# Patient Record
Sex: Male | Born: 1975 | Race: White | Hispanic: No | Marital: Married | Smoking: Former smoker
Health system: Southern US, Community
[De-identification: ages and names within clinical notes are randomized; demographics above are authoritative.]

## PROBLEM LIST (undated history)

## (undated) DIAGNOSIS — J45909 Unspecified asthma, uncomplicated: Secondary | ICD-10-CM

## (undated) DIAGNOSIS — A879 Viral meningitis, unspecified: Secondary | ICD-10-CM

## (undated) DIAGNOSIS — F909 Attention-deficit hyperactivity disorder, unspecified type: Secondary | ICD-10-CM

## (undated) DIAGNOSIS — K900 Celiac disease: Secondary | ICD-10-CM

## (undated) DIAGNOSIS — K219 Gastro-esophageal reflux disease without esophagitis: Secondary | ICD-10-CM

## (undated) HISTORY — DX: Viral meningitis, unspecified: A87.9

## (undated) HISTORY — DX: Gastro-esophageal reflux disease without esophagitis: K21.9

## (undated) HISTORY — PX: MENISECTOMY: SHX5181

## (undated) HISTORY — DX: Attention-deficit hyperactivity disorder, unspecified type: F90.9

---

## 2005-05-31 ENCOUNTER — Ambulatory Visit: Payer: Self-pay | Admitting: Internal Medicine

## 2005-10-02 ENCOUNTER — Emergency Department: Payer: Self-pay | Admitting: Emergency Medicine

## 2005-10-03 ENCOUNTER — Ambulatory Visit: Payer: Self-pay | Admitting: Emergency Medicine

## 2005-12-31 ENCOUNTER — Ambulatory Visit: Payer: Self-pay | Admitting: Family Medicine

## 2006-05-09 ENCOUNTER — Ambulatory Visit: Payer: Self-pay | Admitting: Family Medicine

## 2006-05-16 ENCOUNTER — Ambulatory Visit: Payer: Self-pay | Admitting: Family Medicine

## 2007-04-03 ENCOUNTER — Telehealth: Payer: Self-pay | Admitting: Internal Medicine

## 2007-04-09 ENCOUNTER — Ambulatory Visit: Payer: Self-pay | Admitting: Internal Medicine

## 2007-04-09 DIAGNOSIS — F988 Other specified behavioral and emotional disorders with onset usually occurring in childhood and adolescence: Secondary | ICD-10-CM

## 2007-04-14 ENCOUNTER — Encounter: Payer: Self-pay | Admitting: Internal Medicine

## 2007-04-29 ENCOUNTER — Telehealth (INDEPENDENT_AMBULATORY_CARE_PROVIDER_SITE_OTHER): Payer: Self-pay | Admitting: *Deleted

## 2007-05-12 ENCOUNTER — Encounter: Payer: Self-pay | Admitting: Internal Medicine

## 2007-05-12 DIAGNOSIS — K219 Gastro-esophageal reflux disease without esophagitis: Secondary | ICD-10-CM

## 2007-05-14 ENCOUNTER — Ambulatory Visit: Payer: Self-pay | Admitting: Internal Medicine

## 2007-06-18 ENCOUNTER — Telehealth (INDEPENDENT_AMBULATORY_CARE_PROVIDER_SITE_OTHER): Payer: Self-pay | Admitting: *Deleted

## 2007-07-22 ENCOUNTER — Telehealth (INDEPENDENT_AMBULATORY_CARE_PROVIDER_SITE_OTHER): Payer: Self-pay | Admitting: *Deleted

## 2007-08-14 ENCOUNTER — Ambulatory Visit: Payer: Self-pay | Admitting: Internal Medicine

## 2007-08-25 ENCOUNTER — Encounter: Payer: Self-pay | Admitting: Internal Medicine

## 2007-08-25 ENCOUNTER — Ambulatory Visit: Payer: Self-pay | Admitting: Urology

## 2007-12-11 ENCOUNTER — Telehealth (INDEPENDENT_AMBULATORY_CARE_PROVIDER_SITE_OTHER): Payer: Self-pay | Admitting: *Deleted

## 2007-12-11 ENCOUNTER — Ambulatory Visit: Payer: Self-pay | Admitting: Family Medicine

## 2007-12-11 DIAGNOSIS — M25569 Pain in unspecified knee: Secondary | ICD-10-CM | POA: Insufficient documentation

## 2007-12-18 ENCOUNTER — Encounter: Payer: Self-pay | Admitting: Internal Medicine

## 2008-01-26 ENCOUNTER — Encounter: Admission: RE | Admit: 2008-01-26 | Discharge: 2008-01-26 | Payer: Self-pay | Admitting: *Deleted

## 2008-02-23 ENCOUNTER — Ambulatory Visit: Payer: Self-pay | Admitting: Internal Medicine

## 2008-02-25 LAB — CONVERTED CEMR LAB
ALT: 50 units/L (ref 0–53)
AST: 31 units/L (ref 0–37)
Albumin: 4.3 g/dL (ref 3.5–5.2)
BUN: 9 mg/dL (ref 6–23)
Basophils Absolute: 0 10*3/uL (ref 0.0–0.1)
Bilirubin, Direct: 0.2 mg/dL (ref 0.0–0.3)
CO2: 32 meq/L (ref 19–32)
Chloride: 105 meq/L (ref 96–112)
Eosinophils Absolute: 0 10*3/uL (ref 0.0–0.6)
Eosinophils Relative: 0.7 % (ref 0.0–5.0)
GFR calc non Af Amer: 84 mL/min
Glucose, Bld: 88 mg/dL (ref 70–99)
Iron: 99 ug/dL (ref 42–165)
MCHC: 33.2 g/dL (ref 30.0–36.0)
MCV: 91.6 fL (ref 78.0–100.0)
Neutrophils Relative %: 47.2 % (ref 43.0–77.0)
Phosphorus: 2.9 mg/dL (ref 2.3–4.6)
Potassium: 4.6 meq/L (ref 3.5–5.1)
RDW: 12.4 % (ref 11.5–14.6)
Sodium: 141 meq/L (ref 135–145)
Total Bilirubin: 1.2 mg/dL (ref 0.3–1.2)
Total CHOL/HDL Ratio: 5.1
Triglycerides: 54 mg/dL (ref 0–149)
VLDL: 11 mg/dL (ref 0–40)

## 2008-03-25 ENCOUNTER — Telehealth (INDEPENDENT_AMBULATORY_CARE_PROVIDER_SITE_OTHER): Payer: Self-pay | Admitting: *Deleted

## 2008-03-28 ENCOUNTER — Telehealth (INDEPENDENT_AMBULATORY_CARE_PROVIDER_SITE_OTHER): Payer: Self-pay | Admitting: *Deleted

## 2008-06-24 ENCOUNTER — Ambulatory Visit: Payer: Self-pay | Admitting: Internal Medicine

## 2008-07-29 ENCOUNTER — Ambulatory Visit: Payer: Self-pay | Admitting: Internal Medicine

## 2008-08-29 ENCOUNTER — Telehealth: Payer: Self-pay | Admitting: Internal Medicine

## 2008-09-27 ENCOUNTER — Ambulatory Visit: Payer: Self-pay | Admitting: Internal Medicine

## 2008-10-26 ENCOUNTER — Telehealth: Payer: Self-pay | Admitting: Internal Medicine

## 2008-10-31 ENCOUNTER — Emergency Department (HOSPITAL_COMMUNITY): Admission: EM | Admit: 2008-10-31 | Discharge: 2008-10-31 | Payer: Self-pay | Admitting: Emergency Medicine

## 2008-11-08 ENCOUNTER — Ambulatory Visit: Payer: Self-pay | Admitting: Family Medicine

## 2008-11-08 DIAGNOSIS — S335XXA Sprain of ligaments of lumbar spine, initial encounter: Secondary | ICD-10-CM

## 2008-11-21 ENCOUNTER — Telehealth: Payer: Self-pay | Admitting: Internal Medicine

## 2008-12-15 ENCOUNTER — Encounter: Payer: Self-pay | Admitting: Internal Medicine

## 2008-12-30 ENCOUNTER — Ambulatory Visit: Payer: Self-pay | Admitting: Internal Medicine

## 2009-01-19 ENCOUNTER — Encounter (INDEPENDENT_AMBULATORY_CARE_PROVIDER_SITE_OTHER): Payer: Self-pay | Admitting: *Deleted

## 2009-01-20 ENCOUNTER — Encounter: Admission: RE | Admit: 2009-01-20 | Discharge: 2009-01-20 | Payer: Self-pay | Admitting: Orthopaedic Surgery

## 2009-02-16 ENCOUNTER — Telehealth: Payer: Self-pay | Admitting: Family Medicine

## 2009-03-06 ENCOUNTER — Ambulatory Visit: Payer: Self-pay | Admitting: Family Medicine

## 2009-03-06 DIAGNOSIS — J020 Streptococcal pharyngitis: Secondary | ICD-10-CM | POA: Insufficient documentation

## 2009-03-06 LAB — CONVERTED CEMR LAB: Rapid Strep: NEGATIVE

## 2009-03-28 ENCOUNTER — Telehealth: Payer: Self-pay | Admitting: Internal Medicine

## 2009-04-27 ENCOUNTER — Ambulatory Visit: Payer: Self-pay | Admitting: Internal Medicine

## 2009-04-27 DIAGNOSIS — L723 Sebaceous cyst: Secondary | ICD-10-CM

## 2009-05-03 ENCOUNTER — Telehealth: Payer: Self-pay | Admitting: Internal Medicine

## 2009-06-09 ENCOUNTER — Telehealth: Payer: Self-pay | Admitting: Family Medicine

## 2009-06-26 ENCOUNTER — Ambulatory Visit: Payer: Self-pay | Admitting: Internal Medicine

## 2009-07-31 ENCOUNTER — Telehealth: Payer: Self-pay | Admitting: Internal Medicine

## 2009-09-14 ENCOUNTER — Telehealth: Payer: Self-pay | Admitting: Internal Medicine

## 2009-10-18 ENCOUNTER — Telehealth: Payer: Self-pay | Admitting: Internal Medicine

## 2009-11-24 ENCOUNTER — Telehealth: Payer: Self-pay | Admitting: Family Medicine

## 2009-12-26 ENCOUNTER — Ambulatory Visit: Payer: Self-pay | Admitting: Internal Medicine

## 2009-12-26 DIAGNOSIS — H9190 Unspecified hearing loss, unspecified ear: Secondary | ICD-10-CM | POA: Insufficient documentation

## 2010-01-04 ENCOUNTER — Encounter: Payer: Self-pay | Admitting: Internal Medicine

## 2010-01-31 ENCOUNTER — Telehealth: Payer: Self-pay | Admitting: Internal Medicine

## 2010-03-01 ENCOUNTER — Telehealth: Payer: Self-pay | Admitting: Family Medicine

## 2010-03-28 ENCOUNTER — Telehealth: Payer: Self-pay | Admitting: Internal Medicine

## 2010-05-01 ENCOUNTER — Telehealth: Payer: Self-pay | Admitting: Internal Medicine

## 2010-06-20 ENCOUNTER — Telehealth: Payer: Self-pay | Admitting: Family Medicine

## 2010-08-15 ENCOUNTER — Ambulatory Visit: Payer: Self-pay | Admitting: Family Medicine

## 2010-08-15 DIAGNOSIS — M25539 Pain in unspecified wrist: Secondary | ICD-10-CM | POA: Insufficient documentation

## 2010-08-15 DIAGNOSIS — M79609 Pain in unspecified limb: Secondary | ICD-10-CM

## 2010-09-04 ENCOUNTER — Telehealth: Payer: Self-pay | Admitting: Internal Medicine

## 2010-09-18 ENCOUNTER — Telehealth: Payer: Self-pay | Admitting: Internal Medicine

## 2010-10-24 ENCOUNTER — Encounter: Payer: Self-pay | Admitting: Internal Medicine

## 2010-10-24 ENCOUNTER — Telehealth (INDEPENDENT_AMBULATORY_CARE_PROVIDER_SITE_OTHER): Payer: Self-pay | Admitting: *Deleted

## 2010-12-31 ENCOUNTER — Telehealth: Payer: Self-pay | Admitting: Internal Medicine

## 2011-01-09 NOTE — Assessment & Plan Note (Signed)
Summary: R ARM PAIN/CLE   Vital Signs:  Patient profile:   35 year old male Height:      74 inches Weight:      225 pounds BMI:     28.99 Temp:     97.9 degrees F oral Pulse rate:   80 / minute Pulse rhythm:   regular BP sitting:   130 / 90  (left arm) Cuff size:   regular  Vitals Entered By: Linde Gillis CMA Duncan Dull) (August 15, 2010 10:41 AM) CC: right arm pain   History of Present Illness: 36 year old male:  Pinching and grasping of his hand   pain in forearm.  R side right-sided forearm pain, most painful when he is pronating. Minimal pain with biceps activation with the palm when it is facing upwards. Significantly painful when the palms placing downward. Rotational motions such as when using a screwdriver do hurt quite a bit. No trauma or accident. This has been hurting for the last 2-3 weeks.  b at thumbs.  at the base of his thumbs, bilaterally, does have some pain complicated with grasping with his fifth digit. screwdriver works on computer  R pronator:    Allergies: 1)  ! * Novacaine  Past History:  Past medical, surgical, family and social histories (including risk factors) reviewed, and no changes noted (except as noted below).  Past Medical History: Reviewed history from 05/14/2007 and no changes required. GERD ADHD  Past Surgical History: Reviewed history from 02/23/2008 and no changes required. 5/04  Viral meningitis 2/07  Right 5th metacarpal fracture 2/09 Left knee meniscectomy and plica removed --Dr Magnus Ivan  Family History: Reviewed history from 05/12/2007 and no changes required. Father:Doesn't know - In [place]  Mother: Alive 50 Siblings: 1 brother alive 60 Maternal GM died 63,? MI CV:  GM ? HBP:  Yes Maternal great uncle:  ? liver CA  Social History: Reviewed history from 04/09/2007 and no changes required. 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 Otherwise the pertinent positives of the ROS are noted above.    Physical Exam  General:  GEN: WDWN, NAD, Non-toxic, A & O x 3 HEENT: Atraumatic, Normocephalic. Neck supple. No masses, No LAD. Ears and Nose: No external deformity. CV: RRR, No M/G/R. No JVD. No thrill. No extra heart sounds. PULM: CTA B, no wheezes, crackles, rhonchi. No retractions. No resp. distress. No accessory muscle use. ABD: S, NT, ND, +BS. No rebound tenderness. No HSM.  EXTR: No c/c/e NEURO: Normal gait.  PSYCH: Normally interactive. Conversant. Not depressed or anxious appearing.  Calm demeanor.     Shoulder/Elbow Exam  General:    mild pain in the volar, radial aspect of the palm. Soft tissue and muscle fiber.  Elbow Exam:    Right:    Inspection:  Normal    Palpation:  Normal    Stability:  stable    Tenderness:  no    Swelling:  no    Erythema:  no    tenderness to palpation on the volar aspect of the mid shaft of the forearm rendered around the brachialis and brachioradialis. Pain with rotational motions terminally. No focal edema or bruising  Range of Motion:       Flexion-Active: 135       Extension-Active: 0       Flexion-Passive: 135       Extension-Passive: 0       Elbow Flexion: > 60 seconds    Left:    Inspection:  Normal    Palpation:  Normal    Stability:  stable    Tenderness:  no    Swelling:  no    Erythema:  no    Range of Motion:       Flexion-Active: 135       Extension-Active: 0       Flexion-Passive: 135       Extension-Passive: 0       Elbow Flexion: > 60 seconds   Impression & Recommendations:  Problem # 1:  PAIN IN JOINT, FOREARM (UEA-540.98) Assessment New pronator  syndrome, r  Using an anatomical model, I reviewed with the patient the structures involved and how they related to her diagnosis. The patient indicated that they understood our discussion and the anatomy involved.   reviewed [facility] ketoprofen gel  Problem # 2:  HAND PAIN (ICD-729.5) reassured, all soft tissue overuse  Complete Medication List: 1)  Multivitamins Tabs (Multiple vitamin) .Marland Kitchen.. 1 by mouth daily 2)  Ibuprofen 800 Mg Tabs (Ibuprofen) .... One tab after meals, three times a day 3)  Amphetamine-dextroamphetamine 20 Mg Tabs (Amphetamine-dextroamphetamine) .Marland Kitchen.. 1 two times a day as directed for attention problems 4)  Compound Ketoprofen 10% Topical Lipoderm  .... Pcca # [id number], apply up to 4 times daily as directed (disp 1 month supply) Prescriptions: COMPOUND KETOPROFEN 10% TOPICAL LIPODERM PCCA # [id number], Apply up to 4 times daily as directed (disp 1 month supply)  #1 x 5   Entered and Authorized by:   Hannah Beat MD   Signed by:   Hannah Beat MD on 08/15/2010   Method used:   Print then Give to Patient   RxID:   [id number]   Current Allergies (reviewed today): ! * NOVACAINE

## 2011-01-09 NOTE — Assessment & Plan Note (Signed)
Summary: CPX/RBH   History of Present Illness: Left after 20-25 minutes when he was about to be brought to room Apparently just couldn't wait  Didn't set up another appt Given that he has not been seen by me for 9 months, I may not be able to refill his stimulant medication before another visit is made  Allergies: 1)  ! * Novacaine   Complete Medication List: 1)  Multivitamins Tabs (Multiple vitamin) .Marland Kitchen.. 1 by mouth daily 2)  Ibuprofen 800 Mg Tabs (Ibuprofen) .... One tab after meals, three times a day 3)  Amphetamine-dextroamphetamine 20 Mg Tabs (Amphetamine-dextroamphetamine) .Marland Kitchen.. 1 two times a day as directed for attention problems 4)  Compound Ketoprofen 10% Topical Lipoderm  .... Pcca # [id number], apply up to 4 times daily as directed (disp 1 month supply) 5)  Amphetamine-dextroamphetamine 15 Mg Tabs (Amphetamine-dextroamphetamine) .Marland Kitchen.. 1 tab by mouth two times a day as directed 6)  Amphetamine-dextroamphetamine 5 Mg Tabs (Amphetamine-dextroamphetamine) .Marland Kitchen.. 1 tab by mouth two times a day as directed  Other Orders: No Charge Patient Arrived (NCPA0) (NCPA0)   Orders Added: 1)  No Charge Patient Arrived (NCPA0) [NCPA0]

## 2011-01-09 NOTE — Progress Notes (Signed)
Summary: need to change adderall script  Phone Note From Pharmacy   Caller: [facility] [phone]. Summary of Call: Pt's adderalll dose is on back order.  [facility] is asking if you will write new scripts for 15 and 5 mg's so that pt will get his daily dose of 20 mg's.  The 20 mg's are on back order until december. Initial call taken by: Lowella Petties CMA,  September 18, 2010 4:41 PM  Follow-up for Phone Call        Rx done Follow-up by: Cindee Salt MD,  September 19, 2010 2:12 PM  Additional Follow-up for Phone Call Additional follow up Details #1::        Spoke with patient and advised rx ready for pick-up  Additional Follow-up by: Mervin Hack CMA Duncan Dull),  September 20, 2010 2:18 PM    New/Updated Medications: AMPHETAMINE-DEXTROAMPHETAMINE 15 MG TABS (AMPHETAMINE-DEXTROAMPHETAMINE) 1 tab by mouth two times a day as directed AMPHETAMINE-DEXTROAMPHETAMINE 5 MG TABS (AMPHETAMINE-DEXTROAMPHETAMINE) 1 tab by mouth two times a day as directed Prescriptions: AMPHETAMINE-DEXTROAMPHETAMINE 5 MG TABS (AMPHETAMINE-DEXTROAMPHETAMINE) 1 tab by mouth two times a day as directed  #60 x 0   Entered and Authorized by:   Cindee Salt MD   Signed by:   Cindee Salt MD on 09/19/2010   Method used:   Print then Give to Patient   RxID:   [id number] AMPHETAMINE-DEXTROAMPHETAMINE 15 MG TABS (AMPHETAMINE-DEXTROAMPHETAMINE) 1 tab by mouth two times a day as directed  #60 x 0   Entered and Authorized by:   Cindee Salt MD   Signed by:   Cindee Salt MD on 09/19/2010   Method used:   Print then Give to Patient   RxID:   [id number]

## 2011-01-09 NOTE — Progress Notes (Signed)
Summary: Rx Adderall  Phone Note Call from Patient Call back at [phone]   Caller: Patient Call For: Cindee Salt MD Summary of Call: Patient needs a written rx for his Adderall. Please call when ready for pickup.  Initial call taken by: Sydell Axon LPN,  September 04, 2010 11:14 AM  Follow-up for Phone Call        Rx written Follow-up by: Cindee Salt MD,  September 04, 2010 2:06 PM  Additional Follow-up for Phone Call Additional follow up Details #1::        Spoke with patient and advised rx ready for pick-up  Additional Follow-up by: Mervin Hack CMA Duncan Dull),  September 04, 2010 3:07 PM    New/Updated Medications: AMPHETAMINE-DEXTROAMPHETAMINE 20 MG TABS (AMPHETAMINE-DEXTROAMPHETAMINE) 1 two times a day as directed for attention problems Prescriptions: AMPHETAMINE-DEXTROAMPHETAMINE 20 MG TABS (AMPHETAMINE-DEXTROAMPHETAMINE) 1 two times a day as directed for attention problems  #60 x 0   Entered and Authorized by:   Cindee Salt MD   Signed by:   Cindee Salt MD on 09/04/2010   Method used:   Print then Give to Patient   RxID:   [id number]

## 2011-01-09 NOTE — Consult Note (Signed)
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

## 2011-01-09 NOTE — Assessment & Plan Note (Signed)
Summary: 6 month follow-up   Vital Signs:  Patient profile:   34 year old male Weight:      220 pounds BMI:     28.35 Temp:     98.1 degrees F oral Pulse rate:   80 / minute Pulse rhythm:   regular BP sitting:   120 / 70  (left arm) Cuff size:   regular  Vitals Entered By: Linde Gillis CMA Duncan Dull) (December 26, 2009 8:04 AM) CC: 6 month follow up   History of Present Illness: Doing well No new concerns  Notices that he gets irritate if he is around a lot of noise troubel with large crowds "It almost drives me to insanity" generally he can control it but has had some recent situations that he has had to leave his wife and kids (the RV show at [facility], the outlets) Noise alone can do it --even at house  Notices more hearing problems lately  Work is okay sleeps okay  Allergies: 1)  ! * Novacaine  Past History:  Past medical, surgical, family and social histories (including risk factors) reviewed for relevance to current acute and chronic problems.  Past Medical History: Reviewed history from 05/14/2007 and no changes required. GERD ADHD  Past Surgical History: Reviewed history from 02/23/2008 and no changes required. 5/04  Viral meningitis 2/07  Right 5th metacarpal fracture 2/09 Left knee meniscectomy and plica removed --Dr Magnus Ivan  Family History: Reviewed history from 05/12/2007 and no changes required. Father:Doesn't know - In [place]  Mother: Alive 30 Siblings: 1 brother alive 101 Maternal GM died 60,? MI CV:  GM ? HBP:  Yes Maternal great uncle:  ? liver CA  Social History: Reviewed history from 04/09/2007 and no changes required. 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   Ears:  R ear normal and L ear normal.   Psych:  normally interactive, good eye contact, not anxious appearing, and not depressed appearing.     Impression & Recommendations:  Problem # 1:  HEARING LOSS (ICD-389.9) Assessment New  will set up ENT eval may be part of why he has auditory sensitivity now  Orders: ENT Referral (ENT)  Problem # 2:  ATTENTION DEFICIT DISORDER, ADULT (ICD-314.00) Assessment: Unchanged doing okay with the med will continue current dose  Complete Medication List: 1)  Multivitamins Tabs (Multiple vitamin) .Marland Kitchen.. 1 by mouth daily 2)  Ibuprofen 800 Mg Tabs (Ibuprofen) .... One tab after meals, three times a day 3)  Amphetamine-dextroamphetamine 20 Mg Tabs (Amphetamine-dextroamphetamine) .Marland Kitchen.. 1 two times a day as directed for attention problems  Patient Instructions: 1)  Please schedule a follow-up appointment in 6 months for physical 2)  Referral Appointment Information 3)  Day/Date: 4)  Time: 5)  Place/MD: 6)  Address: 7)  Phone/Fax: 8)  Patient given appointment information. Information/Orders faxed/mailed. Prescriptions: AMPHETAMINE-DEXTROAMPHETAMINE 20 MG TABS (AMPHETAMINE-DEXTROAMPHETAMINE) 1 two times a day as directed for attention problems  #60 x 0   Entered and Authorized by:   Cindee Salt MD   Signed by:   Cindee Salt MD on 12/26/2009   Method used:   Print then Give to Patient   RxID:   [id number]   Current  Allergies (reviewed today): ! * NOVACAINE

## 2011-01-09 NOTE — Progress Notes (Signed)
Summary: refill requests for adderall  Phone Note Refill Request Call back at Home Phone [phone] Message from:  Patient  Refills Requested: Medication #1:  AMPHETAMINE-DEXTROAMPHETAMINE 15 MG TABS 1 tab by mouth two times a day as directed  Medication #2:  AMPHETAMINE-DEXTROAMPHETAMINE 5 MG TABS 1 tab by mouth two times a day as directed.  Medication #3:  AMPHETAMINE-DEXTROAMPHETAMINE 20 MG TABS 1 two times a day as directed for attention problems Please call pt when ready.  Initial call taken by: Lowella Petties CMA, AAMA,  October 24, 2010 11:09 AM  Follow-up for Phone Call        Sp w/ pt, told him per Dr. Alphonsus Sias. He would need an appt. Told pt we believe in giving good pt care to all of our pt. Told him pts need to be monitor by the physicians before contining to precribe any meds.. Pt says he will not be coming back for another appt. Says this is not the first time he has waited and he did not want to wait anymore.  Told pt I would make sure his concerns are  addressed. FYI  to Dr. Alphonsus Sias and Jamesetta So.Daine Gip  October 24, 2010 11:24 AM  SInce we are not discharging him, we have no obligation to do anything more here As long as he knows we are willing to give him appt to get evaluation and continue his meds He can request records be sent to another physician Cindee Salt MD  October 24, 2010 11:42 AM

## 2011-01-09 NOTE — Progress Notes (Signed)
Summary: refill request for adderall  Phone Note Refill Request Call back at Home Phone [phone] Message from:  Patient  Refills Requested: Medication #1:  AMPHETAMINE-DEXTROAMPHETAMINE 20 MG TABS 1 two times a day as directed for attention problems. Please call pt when ready.  Initial call taken by: Lowella Petties CMA,  March 01, 2010 9:20 AM    Prescriptions: AMPHETAMINE-DEXTROAMPHETAMINE 20 MG TABS (AMPHETAMINE-DEXTROAMPHETAMINE) 1 two times a day as directed for attention problems  #60 x 0   Entered and Authorized by:   Ruthe Mannan MD   Signed by:   Ruthe Mannan MD on 03/01/2010   Method used:   Print then Give to Patient   RxID:   [id number]   Appended Document: refill request for adderall Patient Advised. Prescription left at front desk.

## 2011-01-09 NOTE — Progress Notes (Signed)
Summary: needs refill on adderall  Phone Note Refill Request Call back at Home Phone [phone] Call back at [phone] Message from:  Patient  Refills Requested: Medication #1:  AMPHETAMINE-DEXTROAMPHETAMINE 20 MG TABS 1 two times a day as directed for attention problems. Phoned request from pt. This is early but he is going out of town on saturday, going to [place] and will not be able to fill it out of state.  Initial call taken by: Lowella Petties CMA,  March 28, 2010 10:52 AM  Follow-up for Phone Call        Rx written Follow-up by: Cindee Salt MD,  March 28, 2010 1:38 PM  Additional Follow-up for Phone Call Additional follow up Details #1::        Spoke with patient and advised rx ready for pick-up  Additional Follow-up by: Mervin Hack CMA Duncan Dull),  March 28, 2010 2:42 PM    Prescriptions: AMPHETAMINE-DEXTROAMPHETAMINE 20 MG TABS (AMPHETAMINE-DEXTROAMPHETAMINE) 1 two times a day as directed for attention problems  #60 x 0   Entered and Authorized by:   Cindee Salt MD   Signed by:   Cindee Salt MD on 03/28/2010   Method used:   Print then Give to Patient   RxID:   [id number]

## 2011-01-09 NOTE — Progress Notes (Signed)
Summary: refill request for adderall  Phone Note Refill Request Call back at Home Phone [phone] Message from:  Patient  Refills Requested: Medication #1:  AMPHETAMINE-DEXTROAMPHETAMINE 20 MG TABS 1 two times a day as directed for attention problems. Please call pt when ready.  Initial call taken by: Lowella Petties CMA,  January 31, 2010 9:43 AM  Follow-up for Phone Call        Rx written Follow-up by: Cindee Salt MD,  January 31, 2010 1:59 PM  Additional Follow-up for Phone Call Additional follow up Details #1::        Spoke with patient and advised rx ready for pick-up  Additional Follow-up by: Mervin Hack CMA Duncan Dull),  January 31, 2010 2:09 PM    New/Updated Medications: AMPHETAMINE-DEXTROAMPHETAMINE 20 MG TABS (AMPHETAMINE-DEXTROAMPHETAMINE) 1 two times a day as directed for attention problems Prescriptions: AMPHETAMINE-DEXTROAMPHETAMINE 20 MG TABS (AMPHETAMINE-DEXTROAMPHETAMINE) 1 two times a day as directed for attention problems  #60 x 0   Entered and Authorized by:   Cindee Salt MD   Signed by:   Cindee Salt MD on 01/31/2010   Method used:   Print then Give to Patient   RxID:   [id number]

## 2011-01-09 NOTE — Progress Notes (Signed)
Summary: Adderall  Phone Note Refill Request Call back at [phone] Message from:  Patient on May 01, 2010 11:01 AM  Refills Requested: Medication #1:  AMPHETAMINE-DEXTROAMPHETAMINE 20 MG TABS 1 two times a day as directed for attention problems. Please call patient when prescription is ready for pickup.    Method Requested: Pick up at Office Initial call taken by: Delilah Shan CMA Duncan Dull),  May 01, 2010 11:01 AM  Follow-up for Phone Call        Rx written Follow-up by: Cindee Salt MD,  May 01, 2010 1:41 PM  Additional Follow-up for Phone Call Additional follow up Details #1::        left message on machine that rx ready for pick-up  Additional Follow-up by: DeShannon Smith CMA Duncan Dull),  May 01, 2010 2:46 PM    Prescriptions: AMPHETAMINE-DEXTROAMPHETAMINE 20 MG TABS (AMPHETAMINE-DEXTROAMPHETAMINE) 1 two times a day as directed for attention problems  #60 x 0   Entered and Authorized by:   Cindee Salt MD   Signed by:   Cindee Salt MD on 05/01/2010   Method used:   Print then Give to Patient   RxID:   [id number]

## 2011-01-09 NOTE — Progress Notes (Signed)
Summary: adderall   Phone Note Refill Request Call back at Home Phone [phone] Call back at [phone] Message from:  Patient on June 20, 2010 1:47 PM  Refills Requested: Medication #1:  AMPHETAMINE-DEXTROAMPHETAMINE 20 MG TABS 1 two times a day as directed for attention problems.  Method Requested: Pick up at Office Initial call taken by: Melody Comas,  June 20, 2010 2:28 PM  Follow-up for Phone Call        Patient has enough for and will be out and dr Alphonsus Sias will be out until monday  , also does pt needs to sign Controlled Substance Contract?  Follow-up by: Benny Lennert CMA Duncan Dull),  June 20, 2010 4:00 PM  Additional Follow-up for Phone Call Additional follow up Details #1::        Yes, but this should be done at his next office visit. Additional Follow-up by: Hannah Beat MD,  June 20, 2010 5:15 PM    Prescriptions: AMPHETAMINE-DEXTROAMPHETAMINE 20 MG TABS (AMPHETAMINE-DEXTROAMPHETAMINE) 1 two times a day as directed for attention problems  #60 x 0   Entered and Authorized by:   Hannah Beat MD   Signed by:   Hannah Beat MD on 06/20/2010   Method used:   Print then Give to Patient   RxID:   [id number]   Appended Document: adderall  Patient advised rx ready for pick up.Consuello Masse CMA

## 2011-01-10 NOTE — Progress Notes (Signed)
Summary: Want to switch to [facility]  Phone Note Call from Patient Call back at [facility] Phone [phone]   Caller: Patient Call For: Dr.Duncan Summary of Call: Pt. would like to switch from Dr.Bee Marchiano to you.  He said it has been too hard to make an appt. w/ Dr.Brei Pociask and the last time he came he had to wait 1 1/2 hours.  It's hard for him to be flexible w/ his job.  Pt has run out of his Adderall and would like to come in to see you to get a refill.  Please advise. Initial call taken by: Beau Fanny,  December 31, 2010 3:21 PM  Follow-up for Phone Call        If Alphonsus Sias is okay with the change, then it's okay with me.  The last set of rxs would need to come through Dr. Alphonsus Sias to get him through to seeing me.  Please advise patient that while I try to run on time, unexpected patient concerns can put me behind schedule.  he would need OV with me before I can rx his ADD meds.  Follow-up by: Crawford Givens MD,  December 31, 2010 4:29 PM  Additional Follow-up for Phone Call Additional follow up Details #1::        Okay with me The last visit I was 35 minutes late and about to walk into his room and he left Cindee Salt MD  December 31, 2010 4:36 PM     Additional Follow-up for Phone Call Additional follow up Details #2::    Noted.  Please get me a OV with patient in next few weeks and see if Dr. Alphonsus Sias will rx the meds one last time.  Crawford Givens MD,  December 31, 2010 4:50 PM  Rx done Have to give extended release since no regular med around Cindee Salt MD  December 31, 2010 5:04 PM   no answer at home number, left message on work number that identifies him on the machine, advised pt that rx would be up front and that he needs to schedule visit with Dr.Duncan, also wrote note on rx to schedule visit with Dr.Duncan. Follow-up by: Mervin Hack CMA (AAMA),  January 01, 2011 8:21 AM  New/Updated Medications: AMPHETAMINE-DEXTROAMPHETAMINE 20 MG TABS  (AMPHETAMINE-DEXTROAMPHETAMINE) 1 two times a day as directed for attention problems AMPHETAMINE-DEXTROAMPHETAMINE 20 MG XR24H-CAP (AMPHETAMINE-DEXTROAMPHETAMINE) 1 capsule two times a day for attention problems Prescriptions: AMPHETAMINE-DEXTROAMPHETAMINE 20 MG XR24H-CAP (AMPHETAMINE-DEXTROAMPHETAMINE) 1 capsule two times a day for attention problems  #60 x 0   Entered and Authorized by:   Cindee Salt MD   Signed by:   Cindee Salt MD on 12/31/2010   Method used:   Print then Give to Patient   RxID:   [id number]

## 2011-01-15 ENCOUNTER — Ambulatory Visit: Payer: Self-pay | Admitting: Family Medicine

## 2011-01-17 ENCOUNTER — Ambulatory Visit (INDEPENDENT_AMBULATORY_CARE_PROVIDER_SITE_OTHER): Payer: BC Managed Care – PPO | Admitting: Family Medicine

## 2011-01-17 ENCOUNTER — Encounter: Payer: Self-pay | Admitting: Family Medicine

## 2011-01-17 DIAGNOSIS — F988 Other specified behavioral and emotional disorders with onset usually occurring in childhood and adolescence: Secondary | ICD-10-CM

## 2011-01-17 DIAGNOSIS — M79609 Pain in unspecified limb: Secondary | ICD-10-CM

## 2011-01-24 NOTE — Assessment & Plan Note (Signed)
Summary: 30 MIN APPT,OK'D BY DR Ladarrian Asencio/CLE   Vital Signs:  Patient profile:   35 year old male Height:      74 inches Weight:      226.25 pounds BMI:     29.15 Temp:     98.2 degrees F oral Pulse rate:   80 / minute Pulse rhythm:   regular BP sitting:   142 / 90  (left arm) Cuff size:   large  Vitals Entered By: Delilah Shan CMA Bibiana Gillean Dull) (January 17, 2011 2:18 PM)  Serial Vital Signs/Assessments:  Time      Position  BP       Pulse  Resp  Temp     By 2:25 PM   R Arm     130/96                         Lugene Fuquay CMA (AAMA)  CC: 30 min. appt.    History of Present Illness: Compliant with meds:yes benefit from med (ie increase in concentration):yes, less impulsive change in mood: minimal, usually when coming off the medicine change in appetite:some decrease in appetite, weight is now stable Insomnia: no change- usually 5-6 hours per night, at baseline tremor:no compliant with behavioral modification:  H/o speeding tickets.  Likely FH of ADD.  Longstanding symptoms for patient.  Dx'd at age 33.  Was tested at the time.  Started on meds about 2 years ago.  Compliant.  Would like to cut the dose on the weekend.  No abuse/misuse of meds.  We talked about this today.  Misuse/abuse would prevent me from continuing to rx the meds.    R thenar cramping when he works with his hands.  R handed.  Not tender to palpation o/w.  No weakness.   Current Medications (verified): 1)  Multivitamins  Tabs (Multiple Vitamin) .Marland Kitchen.. 1 By Mouth Daily 2)  Ibuprofen 800 Mg Tabs (Ibuprofen) .... One Tab After Meals, Three Times A Day As Needed, Gi Caution 3)  Amphetamine-Dextroamphetamine 20 Mg Xr24h-Cap (Amphetamine-Dextroamphetamine) .Marland Kitchen.. 1 Capsule Two Times A Day For Attention Problems  Allergies: 1)  ! * Novacaine  Past History:  Past Medical History: Last updated: 05/14/2007 GERD ADHD  Past Surgical History: Last updated: 02/23/2008 5/04  Viral meningitis 2/07  Right 5th metacarpal  fracture 2/09 Left knee meniscectomy and plica removed --Dr Magnus Ivan  Family History: Reviewed history from 05/12/2007 and no changes required. Father:Doesn't know - In [place]  Mother: Alive, celiac disease, possible crohns?, likely ADD Siblings: 1 brother alive Maternal GM died 18,? MI CV:  GM ? HBP:  Yes Maternal great uncle:  ? liver CA  Social History: Reviewed history from 04/09/2007 and no changes required. 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  Otherwise negative.    Physical Exam  General:  GEN: nad, alert and oriented, affect wnl and appropriate HEENT: mucous membranes moist NECK: supple w/o LA CV: rrr.  PULM: ctab, no inc wob ABD: soft, +bs EXT: no edema CN 2-12 wnl, s/s/dtr wnl x4.  No tremor.   R hand with normal inspection.  No weakness or thenar wasting.  nv intact.  Lourena Simmonds is neg.  no carpal entrapment symptoms on testing   Impression & Recommendations:  Problem # 1:  ATTENTION DEFICIT DISORDER, ADULT (  ICD-314.00) >25 min spent with patient, at least half of which was spent on counseling.  Will decrease the dose on weekend and he'll call back with update on symptoms/BP.  He agrees.  If he is doing well, we can do rx for 3 month duration- 3 rxs with 1 month each.  He agrees.  Plan to follow up later in the summer.  See instructions.  d/w patient [id number] mods to help with ADD.  He understood.   Problem # 2:  HAND PAIN (ICD-729.5) He'll stretch before work and cut down on soda to see if this helps.  follow up as needed.    Complete Medication List: 1)  Multivitamins Tabs (Multiple vitamin) .Marland Kitchen.. 1 by mouth daily 2)  Ibuprofen 800 Mg Tabs (Ibuprofen) .... One tab after meals, three times a day as needed, gi caution 3)  Adderall Xr 10 Mg  Xr24h-cap (Amphetamine-dextroamphetamine) .... 3 by mouth once daily on monday to friday and 2 by mouth once daily on saturday and sunday  Patient Instructions: 1)  Call with an update on your blood pressure.   2)  I would stretch your hand before work and try to cut down on soda. 3)  See how the new med dose works and call me with an update on that.   4)  I would plan on a follow up appointment- - in 6 months.  fasting lipid and glucose before the visit.  v17.3 5)  Take care.  Glad to see you today.  Prescriptions: ADDERALL XR 10 MG XR24H-CAP (AMPHETAMINE-DEXTROAMPHETAMINE) 3 by mouth once daily on Monday to Friday and 2 by mouth once daily on Saturday and Sunday  #80 x 0   Entered and Authorized by:   Crawford Givens MD   Signed by:   Crawford Givens MD on 01/17/2011   Method used:   Print then Give to Patient   RxID:   [id number]    Orders Added: 1)  Est. Patient Level IV [86578]    Current Allergies (reviewed today): ! * NOVACAINE

## 2011-04-16 ENCOUNTER — Other Ambulatory Visit: Payer: Self-pay | Admitting: *Deleted

## 2011-04-16 MED ORDER — AMPHETAMINE-DEXTROAMPHET ER 10 MG PO CP24: ORAL_CAPSULE | ORAL | Status: DC

## 2011-04-16 NOTE — Telephone Encounter (Signed)
Patient advised.  Rx's left at front desk for pick up. 

## 2011-04-16 NOTE — Telephone Encounter (Signed)
Please call pt when ready, please check quantity amount.

## 2011-04-16 NOTE — Telephone Encounter (Signed)
3 rxs done.  Please give to patient.

## 2011-07-16 ENCOUNTER — Other Ambulatory Visit: Payer: BC Managed Care – PPO

## 2011-07-17 ENCOUNTER — Encounter: Payer: Self-pay | Admitting: Family Medicine

## 2011-07-17 ENCOUNTER — Ambulatory Visit (INDEPENDENT_AMBULATORY_CARE_PROVIDER_SITE_OTHER): Payer: BC Managed Care – PPO | Admitting: Family Medicine

## 2011-07-17 VITALS — BP 140/90 | HR 100 | Temp 97.5°F | Ht 75.0 in | Wt 228.4 lb

## 2011-07-17 DIAGNOSIS — M25561 Pain in right knee: Secondary | ICD-10-CM

## 2011-07-17 DIAGNOSIS — L02415 Cutaneous abscess of right lower limb: Secondary | ICD-10-CM

## 2011-07-17 DIAGNOSIS — L03119 Cellulitis of unspecified part of limb: Secondary | ICD-10-CM

## 2011-07-17 DIAGNOSIS — M25569 Pain in unspecified knee: Secondary | ICD-10-CM

## 2011-07-17 NOTE — Patient Instructions (Signed)
Appt with Dr. Rayburn Ma at 12:45 PM today at [facility]

## 2011-07-17 NOTE — Progress Notes (Signed)
  Subjective:    Patient ID: Brandon Barr, male    DOB: 01/22/1976, 35 y.o.   MRN: [id number]  HPI  R knee: Pressure in his knee. On Saturday -- literally could not walk on R knee. 5 days ago, had swelling, redness. 1st medical evaluation today.  Wife had some doxycycline and he took it and the swelling went away or at least decreased.  Wife has had MRSA in the past - has had a couple of MRSA skin infections in the last 18 months.   Pain in the suprapatellar bursa region, pain with compression of the kneecap. Redness and slight fluctuant area over patella on the right. Decreased ROM  Now able to walk.  The PMH, PSH, Social History, Family History, Medications, and allergies have been reviewed in [facility], and have been updated if relevant.  Review of Systems REVIEW OF SYSTEMS  GEN: No fevers, chills. Nontoxic. Primarily MSK c/o today. Warmth and history above. MSK: Detailed in the HPI GI: tolerating PO intake without difficulty Neuro: No numbness, parasthesias, or tingling associated. Otherwise the pertinent positives of the ROS are noted above.      Objective:   Physical Exam   Physical Exam  Blood pressure 140/90, pulse 100, temperature 97.5 F (36.4 C), temperature source Oral, height 6\' 3"  (1.905 m), weight 228 lb 6.4 oz (103.602 kg), SpO2 98.00%.  GEN: WDWN, NAD, Non-toxic, A & O x 3 HEENT: Atraumatic, Normocephalic. Neck supple. No masses, No LAD. Ears and Nose: No external deformity. EXTR: No c/c/e NEURO Normal gait.  PSYCH: Normally interactive. Conversant. Not depressed or anxious appearing.  Calm demeanor.   Knee, R: 3 cm across red flat area with erythema and area of centralized fluctuance and a whitehead appearance. Pain with compression of suprapatellar bursa, compression of patella. Restriction of motion to 100 flexion. Warm diffusely anteriorly.     Assessment & Plan:   1. Right knee pain   2. Abscess of knee, right    High level of concern for abscess  that may communicate to knee. Concern for potential septic joint. Suspect partially treated with self medication of doxycycline and improved ability to ambulate.   Explained nature of my concern and potential complications and arranged for surgical evaluation today by Dr. Rayburn Barr. I appreciate his assistance.

## 2011-07-18 ENCOUNTER — Other Ambulatory Visit: Payer: BC Managed Care – PPO

## 2011-07-19 ENCOUNTER — Other Ambulatory Visit (INDEPENDENT_AMBULATORY_CARE_PROVIDER_SITE_OTHER): Payer: BC Managed Care – PPO | Admitting: Family Medicine

## 2011-07-19 DIAGNOSIS — Z8249 Family history of ischemic heart disease and other diseases of the circulatory system: Secondary | ICD-10-CM

## 2011-07-19 LAB — LIPID PANEL
Cholesterol: 150 mg/dL (ref 0–200)
HDL: 39.5 mg/dL (ref >39.00)
Total CHOL/HDL Ratio: 4
Triglycerides: 66 mg/dL (ref 0.0–149.0)
VLDL: 13.2 mg/dL (ref 0.0–40.0)

## 2011-07-19 LAB — GLUCOSE, RANDOM: Glucose, Bld: 100 mg/dL — ABNORMAL HIGH (ref 70–99)

## 2011-07-23 ENCOUNTER — Ambulatory Visit (INDEPENDENT_AMBULATORY_CARE_PROVIDER_SITE_OTHER): Payer: BC Managed Care – PPO | Admitting: Family Medicine

## 2011-07-23 ENCOUNTER — Encounter: Payer: Self-pay | Admitting: Family Medicine

## 2011-07-23 DIAGNOSIS — F988 Other specified behavioral and emotional disorders with onset usually occurring in childhood and adolescence: Secondary | ICD-10-CM

## 2011-07-23 DIAGNOSIS — Z23 Encounter for immunization: Secondary | ICD-10-CM

## 2011-07-23 MED ORDER — AMPHETAMINE-DEXTROAMPHETAMINE 20 MG PO TABS: 20.0000 mg | ORAL_TABLET | Freq: Every day | ORAL | Status: DC

## 2011-07-23 NOTE — Progress Notes (Signed)
His knee infection resolved and didn't need surgery.  No FCNAV.  No pain now. Weight bearing.  Glucose 100.  Labs d/w pt.  Occ exercise, jogging/swimming.  We talked about diet.  He doesn't have a lot of sweets in diet.   Screening for hyperlipidemia.  Labs d/w pt.   ADD Compliant with meds: yes benefit from med (ie increase in concentration): benefit has waned change in mood: some, due to insomnia change in appetite: not decreased Insomnia:yes, worse with extended release tremor:no compliant with behavioral modification: yes He would like to switch back to plain adderall, not extended release.    PMH and SH reviewed  ROS: See HPI.  Otherwise noncontributory.   Meds, vitals, and allergies reviewed.   GEN: nad, alert and oriented, affect wnl and appropriate HEENT: mucous membranes moist NECK: supple w/o LA CV: rrr.  PULM: ctab, no inc wob ABD: soft, +bs EXT: no edema CN 2-12 wnl, s/s/dtr wnl x4.  No tremor.

## 2011-07-23 NOTE — Progress Notes (Signed)
Addended by: Sydell Axon C on: 07/23/2011 09:17 AM   Modules accepted: Orders

## 2011-07-23 NOTE — Patient Instructions (Signed)
Try the short acting adderall and see if that helps with the insomnia.  Let me know if you have other concerns.  Take care.  Gradually increase your amount of exercise.  Schedule a follow up appointment in 6 months.  30 min visit.

## 2011-07-23 NOTE — Assessment & Plan Note (Addendum)
>  25 min spent with face to face with patient, >50% counseling.  Change to short acting adderall and he'll call back as needed with update.  3 months of rxs are done today.  Plan for 6 months f/u.  No illicits.  He agrees with plan.  Also d/w pt about behav mods and exercise (esp for sugar).  His knee pain is resolved.

## 2011-10-18 ENCOUNTER — Telehealth: Payer: Self-pay | Admitting: Internal Medicine

## 2011-10-18 MED ORDER — AMPHETAMINE-DEXTROAMPHETAMINE 20 MG PO TABS: 40.0000 mg | ORAL_TABLET | Freq: Every day | ORAL | Status: DC

## 2011-10-18 NOTE — Telephone Encounter (Signed)
I called pt.  He doesn't have good effect with current dose and would like to increase.  Will continue the short acting form to try to prevent insomnia.  He'll call back if he has ADE.  O/w plan for f/u in 3 months.  3 rxs printed.  Will be at the front desk for pick up on Monday.

## 2011-10-18 NOTE — Telephone Encounter (Signed)
Patient called and stated the lower dose of Adderall is not working, and he is requesting 40 mg daily which was what he was previously taking.  He wanted to know if he could be switched back.  Please advise.

## 2011-10-18 NOTE — Telephone Encounter (Signed)
Addended by: Lars Mage on: 10/18/2011 05:07 PM   Modules accepted: Orders

## 2011-12-24 ENCOUNTER — Telehealth: Payer: Self-pay | Admitting: *Deleted

## 2011-12-24 NOTE — Telephone Encounter (Signed)
Received faxed form from pharmacy requesting prior authorization on Adderall. Called and request paperwork from [employer] to start the process for PA. Paperwork is in your in box to complete and fax back.

## 2011-12-25 NOTE — Telephone Encounter (Signed)
Completed form faxed back. To [employer].  Copy sent to be scanned into the chart.

## 2011-12-25 NOTE — Telephone Encounter (Signed)
Please scan and send back.  Thanks.

## 2012-01-28 ENCOUNTER — Telehealth: Payer: Self-pay | Admitting: Family Medicine

## 2012-01-28 MED ORDER — AMPHETAMINE-DEXTROAMPHETAMINE 20 MG PO TABS: 40.0000 mg | ORAL_TABLET | Freq: Every day | ORAL | Status: DC

## 2012-01-28 NOTE — Telephone Encounter (Signed)
[place], [place] [address] p. [phone] f. [phone] To: [facility] (Daytime Triage) Fax: [phone] From: [facility] Date/ Time: 01/28/2012 12:12 PM Taken By: Thayer Headings, RN Caller: Doug Facility: not collected Patient: Brandon Barr, Brandon Barr DOB: 1976/11/20 Phone: [phone] Reason for Call: Pt calling today 01/28/12 regarding refill for Adderall written by Dr. Para March. His refill has expired. Has to call every 3 months to get refill. Pt says he has had office visit in last 6 months. [facility] [address] [place]. Needs to know when he can come by office to pick up this prescription. It cannot be called in. PLEASE CALL PT BACK AT [phone]. Regarding Appointment: Appt Date: Appt Time: Unknown Provider: Reason: Details: Outcome: confidential

## 2012-01-28 NOTE — Telephone Encounter (Signed)
Pt was seen 8/12.  Is due for another OV, I usually see people 2x/year if doing well.  Rx x3 printed.  He can get 3 copies; we can't do more than that at one time.

## 2012-01-28 NOTE — Telephone Encounter (Signed)
Pt advised.  Rx left at front desk for pick up. 

## 2012-04-23 ENCOUNTER — Ambulatory Visit (INDEPENDENT_AMBULATORY_CARE_PROVIDER_SITE_OTHER): Payer: BC Managed Care – PPO | Admitting: Family Medicine

## 2012-04-23 ENCOUNTER — Encounter: Payer: Self-pay | Admitting: Family Medicine

## 2012-04-23 VITALS — BP 130/88 | HR 88 | Temp 98.2°F | Wt 227.8 lb

## 2012-04-23 DIAGNOSIS — M549 Dorsalgia, unspecified: Secondary | ICD-10-CM

## 2012-04-23 DIAGNOSIS — F988 Other specified behavioral and emotional disorders with onset usually occurring in childhood and adolescence: Secondary | ICD-10-CM

## 2012-04-23 MED ORDER — AMPHETAMINE-DEXTROAMPHETAMINE 20 MG PO TABS: 40.0000 mg | ORAL_TABLET | Freq: Every day | ORAL | Status: DC

## 2012-04-23 NOTE — Assessment & Plan Note (Signed)
Benign MSK exam, needs to stretch hamstrings/low back and this was discussed.  F/u prn.

## 2012-04-23 NOTE — Patient Instructions (Signed)
Stretch your hamstrings and don't change your meds.  Take care.  Recheck in 6 months.  Call with questions.  Glad to see you.

## 2012-04-23 NOTE — Progress Notes (Signed)
ADD  Compliant with meds: yes  benefit from med (ie increase in concentration): yes change in mood: no, better on short acting version on med change in appetite: not decreased  Insomnia: no tremor:no  compliant with behavioral modification: yes  He switched back to plain adderall, not extended release, and did well He cut out caffeine.  He had some HA for 3-4 days but then felt much better.   Doing well at work.   He had some episodic back pain.  Lower, bilateral.  It has self resolved.  It may have been related to inactivity.  It can happen in AM after getting out of bed. Rare NSAID use.  No radiation. No weakness.    ROS: See HPI. Otherwise noncontributory.   Meds, vitals, and allergies reviewed.   GEN: nad, alert and oriented, affect wnl and appropriate  HEENT: mucous membranes moist  NECK: supple w/o LA  CV: rrr.  PULM: ctab, no inc wob  ABD: soft, +bs  EXT: no edema  CN 2-12 wnl, s/s/dtr wnl x4. No tremor Back not ttp and normal rom at hips.  Hamstrings tight.  SLR neg x2.

## 2012-04-23 NOTE — Assessment & Plan Note (Signed)
Doing well, continue current meds.  3 months of rxs done. Recheck in 6 months, sooner prn.

## 2012-06-15 ENCOUNTER — Ambulatory Visit (INDEPENDENT_AMBULATORY_CARE_PROVIDER_SITE_OTHER): Payer: BC Managed Care – PPO | Admitting: Family Medicine

## 2012-06-15 ENCOUNTER — Encounter: Payer: Self-pay | Admitting: Family Medicine

## 2012-06-15 VITALS — BP 128/88 | HR 84 | Temp 97.7°F | Wt 231.0 lb

## 2012-06-15 DIAGNOSIS — M549 Dorsalgia, unspecified: Secondary | ICD-10-CM

## 2012-06-15 MED ORDER — CYCLOBENZAPRINE HCL 10 MG PO TABS: 5.0000 mg | ORAL_TABLET | Freq: Three times a day (TID) | ORAL | Status: AC | PRN

## 2012-06-15 NOTE — Progress Notes (Signed)
Back pain.  Saturday AM, sudden onset pain with twisting.  It resolved but then came back later in the day.  He had diffuse muscle tightness in trunk.  He has a tender point on the L side of the spine, mid T spine, where the pain originates.   Ibuprofen and aleve don't help.  Flexeril didn't help but it was out of date.  No trauma or trigger recently.  No bowel/bladder dysfunction.  No weakness.  No rash.  Meds, vitals, and allergies reviewed.   ROS: See HPI.  Otherwise, noncontributory.  nad ncat Back w/o midline pain L mid T spine with muscle spasm noted, ttp.  No rash Pain with forward flexion at the site of the spasm No pain with extension/facet loading.  Distally NV intact Neck with normal ROM

## 2012-06-15 NOTE — Assessment & Plan Note (Signed)
Benign exam, no need to image at this point.  Would use stretching, knee to chest, trunk rotation, back flexion exercises.  Gi caution for nsaids, use aleve but not additional ibuprofen.  Use flexeril and heat.  Should improve.  F/u prn.

## 2012-06-15 NOTE — Patient Instructions (Signed)
Take aleve with food, use a heating pad and stretch as we discussed.  Take flexeril as needed.  It can make you drowsy.  Take care.

## 2012-07-21 ENCOUNTER — Other Ambulatory Visit: Payer: Self-pay | Admitting: Family Medicine

## 2012-07-21 MED ORDER — AMPHETAMINE-DEXTROAMPHETAMINE 20 MG PO TABS: 40.0000 mg | ORAL_TABLET | Freq: Every day | ORAL | Status: DC

## 2012-07-21 NOTE — Telephone Encounter (Signed)
He'll be able to pick up tomorrow.  Printed.

## 2012-07-21 NOTE — Telephone Encounter (Signed)
Caller: Doug/Patient; Patient Name: Brandon Barr; PCP: Crawford Givens Clelia Croft); [facility] Phone Number: [phone]; needs rx for Adderall; using his last pill today 07/21/12

## 2012-07-21 NOTE — Telephone Encounter (Signed)
Patient advised.  Rx left at front desk for pick up. 

## 2012-08-20 ENCOUNTER — Other Ambulatory Visit: Payer: Self-pay | Admitting: Family Medicine

## 2012-08-20 MED ORDER — AMPHETAMINE-DEXTROAMPHETAMINE 20 MG PO TABS: 40.0000 mg | ORAL_TABLET | Freq: Every day | ORAL | Status: DC

## 2012-08-20 NOTE — Telephone Encounter (Signed)
Caller: Doug/Patient; Patient Name: Brandon Barr; PCP: Crawford Givens Clelia Croft) [facility]); [facility] Phone Number: [phone].  Pt. requesting refills on Adderal 20mg ; take 2 tablets daily.  Please prescribe 3 separate prescriptions, and post date 2 of them.  Please call pt. when these are ready for pickup.  CAN/db.

## 2012-08-20 NOTE — Telephone Encounter (Signed)
3 rxs printed, 2 postdated.  Please give to patient.  Thanks.

## 2012-08-20 NOTE — Telephone Encounter (Signed)
Patient advised.  Rx left at front desk for pick up. 

## 2012-10-26 ENCOUNTER — Ambulatory Visit: Payer: BC Managed Care – PPO | Admitting: Family Medicine

## 2012-10-27 ENCOUNTER — Other Ambulatory Visit: Payer: BC Managed Care – PPO

## 2012-10-27 ENCOUNTER — Ambulatory Visit (INDEPENDENT_AMBULATORY_CARE_PROVIDER_SITE_OTHER): Payer: BC Managed Care – PPO | Admitting: Family Medicine

## 2012-10-27 ENCOUNTER — Encounter: Payer: Self-pay | Admitting: Family Medicine

## 2012-10-27 VITALS — BP 116/86 | HR 74 | Temp 97.9°F | Wt 233.0 lb

## 2012-10-27 DIAGNOSIS — N529 Male erectile dysfunction, unspecified: Secondary | ICD-10-CM

## 2012-10-27 DIAGNOSIS — F988 Other specified behavioral and emotional disorders with onset usually occurring in childhood and adolescence: Secondary | ICD-10-CM

## 2012-10-27 DIAGNOSIS — R739 Hyperglycemia, unspecified: Secondary | ICD-10-CM

## 2012-10-27 DIAGNOSIS — R5383 Other fatigue: Secondary | ICD-10-CM

## 2012-10-27 DIAGNOSIS — R7309 Other abnormal glucose: Secondary | ICD-10-CM

## 2012-10-27 DIAGNOSIS — R6882 Decreased libido: Secondary | ICD-10-CM

## 2012-10-27 DIAGNOSIS — R5381 Other malaise: Secondary | ICD-10-CM

## 2012-10-27 MED ORDER — AMPHETAMINE-DEXTROAMPHETAMINE 20 MG PO TABS: 40.0000 mg | ORAL_TABLET | Freq: Every day | ORAL | Status: DC

## 2012-10-27 NOTE — Progress Notes (Signed)
ADD Compliant with meds: yes benefit from med (ie increase in concentration): yes change in mood: no change in appetite: no sig changes Insomnia: no Tremor: no compliant with behavioral modification: yes He still has some trouble with long term organization and planning, in spite of meds.  Working from home.   He cut out all soda/caffeine. HA are much improved.    Sex drive is decreased.  Fatigue, ED.  Started in last 2-3 years, progressively worse.    Already had a flu shot.    PMH and SH reviewed  ROS: See HPI.  Otherwise noncontributory.   Meds, vitals, and allergies reviewed.   GEN: nad, alert and oriented, affect wnl and appropriate HEENT: mucous membranes moist NECK: supple w/o LA CV: rrr.  PULM: ctab, no inc wob ABD: soft, +bs EXT: no edema CN 2-12 wnl, s/s/dtr wnl x4.  No tremor.

## 2012-10-27 NOTE — Patient Instructions (Addendum)
Come back for fasting labs.  We'll contact you with your lab report. Don't change your meds for now. Recheck in 6 months. Take care.

## 2012-10-28 ENCOUNTER — Other Ambulatory Visit (INDEPENDENT_AMBULATORY_CARE_PROVIDER_SITE_OTHER): Payer: BC Managed Care – PPO

## 2012-10-28 ENCOUNTER — Telehealth: Payer: Self-pay

## 2012-10-28 DIAGNOSIS — N529 Male erectile dysfunction, unspecified: Secondary | ICD-10-CM | POA: Insufficient documentation

## 2012-10-28 DIAGNOSIS — R739 Hyperglycemia, unspecified: Secondary | ICD-10-CM

## 2012-10-28 DIAGNOSIS — R6882 Decreased libido: Secondary | ICD-10-CM

## 2012-10-28 DIAGNOSIS — R7309 Other abnormal glucose: Secondary | ICD-10-CM

## 2012-10-28 DIAGNOSIS — R5383 Other fatigue: Secondary | ICD-10-CM

## 2012-10-28 LAB — TESTOSTERONE: Testosterone: 489.86 ng/dL (ref 350.00–890.00)

## 2012-10-28 LAB — GLUCOSE, RANDOM: Glucose, Bld: 108 mg/dL — ABNORMAL HIGH (ref 70–99)

## 2012-10-28 LAB — TSH: TSH: 1.58 u[IU]/mL (ref 0.35–5.50)

## 2012-10-28 NOTE — Telephone Encounter (Signed)
Pt requested lab values just discussed on phone earlier;glucose 108; tsh 1.58; testosterone 489.86 and hgb 17.4 were given to pt.

## 2012-10-28 NOTE — Assessment & Plan Note (Signed)
T isn't low and other labs are unremarkable other than mild inc in sugar.  See notes on labs.  Will offer treatment with meds but would rec inc in exercise in meantime.

## 2012-10-28 NOTE — Assessment & Plan Note (Signed)
Some improvement with meds, would continue as is.  He agrees.  Continue with behavioral compensation.  Recheck 6 months.

## 2012-11-09 ENCOUNTER — Encounter: Payer: BC Managed Care – PPO | Admitting: Family Medicine

## 2012-12-17 ENCOUNTER — Ambulatory Visit (INDEPENDENT_AMBULATORY_CARE_PROVIDER_SITE_OTHER): Payer: BC Managed Care – PPO | Admitting: Family Medicine

## 2012-12-17 ENCOUNTER — Encounter: Payer: Self-pay | Admitting: Family Medicine

## 2012-12-17 VITALS — BP 120/82 | HR 84 | Temp 98.4°F | Wt 243.0 lb

## 2012-12-17 DIAGNOSIS — R221 Localized swelling, mass and lump, neck: Secondary | ICD-10-CM

## 2012-12-17 DIAGNOSIS — R220 Localized swelling, mass and lump, head: Secondary | ICD-10-CM

## 2012-12-17 DIAGNOSIS — Z7721 Contact with and (suspected) exposure to potentially hazardous body fluids: Secondary | ICD-10-CM

## 2012-12-17 LAB — CBC WITH DIFFERENTIAL/PLATELET
Basophils Absolute: 0 10*3/uL (ref 0.0–0.1)
HCT: 50.9 % (ref 39.0–52.0)
Lymphs Abs: 2.7 10*3/uL (ref 0.7–4.0)
MCV: 90.3 fl (ref 78.0–100.0)
Monocytes Absolute: 0.4 10*3/uL (ref 0.1–1.0)
Neutro Abs: 3.1 10*3/uL (ref 1.4–7.7)
Platelets: 176 10*3/uL (ref 150.0–400.0)
RDW: 13.1 % (ref 11.5–14.6)

## 2012-12-17 NOTE — Progress Notes (Signed)
Lump on neck, single lesion, under L side of the jaw.  Present for about 1 month. Not ttp.  Noted incidentally.  It isn't getting smaller, slowly enlarging per patient.  Feeling well except for longstanding fatigue.  No FCNAVD but he has occ hot flashes and sweats at night. No weight loss. No rhinorrhea, no ST.  R ear is ringing some.  Distant tobacco use, none current.  No other skin lesions or lumps known.   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   Meds, vitals, and allergies reviewed.   ROS: See HPI.  Otherwise, noncontributory.  nad ncat Tm w/o erythema Nasal and OP exam wnl Small, nontender area inferior to L angle of the jaw note but no other masses or LA noted rrr ctab

## 2012-12-17 NOTE — Patient Instructions (Addendum)
Go to the lab on the way out.  We'll contact you with your lab report.  If the spot on your neck doesn't go down and you can still feel it in 2 weeks, then notify me.  Take care.

## 2012-12-18 DIAGNOSIS — Z7721 Contact with and (suspected) exposure to potentially hazardous body fluids: Secondary | ICD-10-CM | POA: Insufficient documentation

## 2012-12-18 DIAGNOSIS — R221 Localized swelling, mass and lump, neck: Secondary | ICD-10-CM | POA: Insufficient documentation

## 2012-12-18 NOTE — Assessment & Plan Note (Signed)
Check HIV though not a high risk exposure history.

## 2012-12-18 NOTE — Assessment & Plan Note (Signed)
Possible submandibular salivary gland enlargement vs node.  Would check CBC and if persisting 2 more weeks, he'll notify the clinic.  He agrees.  Consider imaging vs ent referral then.

## 2013-01-14 ENCOUNTER — Telehealth: Payer: Self-pay

## 2013-01-14 NOTE — Telephone Encounter (Signed)
Prior auth needed Adderall; form in Dr Lianne Bushy in box.

## 2013-01-14 NOTE — Telephone Encounter (Signed)
Will address the hard copy.  

## 2013-01-19 NOTE — Telephone Encounter (Signed)
adderall approved; [facility] notified and letter placed on Dr Lianne Bushy desk for signature and scanning.

## 2013-02-05 ENCOUNTER — Encounter: Payer: Self-pay | Admitting: Family Medicine

## 2013-02-05 ENCOUNTER — Ambulatory Visit (INDEPENDENT_AMBULATORY_CARE_PROVIDER_SITE_OTHER): Payer: BC Managed Care – PPO | Admitting: Family Medicine

## 2013-02-05 VITALS — BP 122/92 | HR 82 | Temp 98.3°F | Wt 234.0 lb

## 2013-02-05 DIAGNOSIS — R221 Localized swelling, mass and lump, neck: Secondary | ICD-10-CM

## 2013-02-05 DIAGNOSIS — R220 Localized swelling, mass and lump, head: Secondary | ICD-10-CM

## 2013-02-05 NOTE — Patient Instructions (Signed)
See Marion about your referral before you leave today. Take care.  

## 2013-02-05 NOTE — Progress Notes (Signed)
Still with a lump on the L side of the neck.  He doesn't think it is getting smaller, unclear if it is enlarging.  It isn't ttp.  Feels well o/w.  No FCNAVD.  No tobacco.    Meds, vitals, and allergies reviewed.   ROS: See HPI.  Otherwise, noncontributory.  nad ncat Tm wnl Nasal and OP exam wnl Neck supple, no LA except for small (<1cm) mobile mass under the L angle of the jaw No other masses No skin changes o/w locally.

## 2013-02-08 ENCOUNTER — Ambulatory Visit: Payer: Self-pay | Admitting: Family Medicine

## 2013-02-08 NOTE — Assessment & Plan Note (Signed)
Refer for u/s given the duration of the mass.  He agrees.  ddx d/w. No other alarming sx or signs.

## 2013-02-09 ENCOUNTER — Encounter: Payer: Self-pay | Admitting: Family Medicine

## 2013-03-09 ENCOUNTER — Telehealth: Payer: Self-pay | Admitting: Family Medicine

## 2013-03-09 DIAGNOSIS — R591 Generalized enlarged lymph nodes: Secondary | ICD-10-CM

## 2013-03-09 NOTE — Telephone Encounter (Signed)
Patient called to ask you to refer him Brandon Barr , the lump in his neck is still there and he wants to proceed with the referral. Best number to call him is [phone]. Or home#[phone]. Patient says he sent a Mychart message last week.

## 2013-03-09 NOTE — Telephone Encounter (Signed)
I don't recall getting the mychart message and I don't see it in EPIC.  Please pass this along to A Sauls to check on this.   I put in the referral.  Thanks.

## 2013-03-19 ENCOUNTER — Ambulatory Visit: Payer: Self-pay | Admitting: Otolaryngology

## 2013-04-28 ENCOUNTER — Telehealth: Payer: Self-pay | Admitting: Family Medicine

## 2013-04-28 MED ORDER — AMPHETAMINE-DEXTROAMPHETAMINE 20 MG PO TABS: 40.0000 mg | ORAL_TABLET | Freq: Every day | ORAL | Status: DC

## 2013-04-28 NOTE — Telephone Encounter (Signed)
Patient notified by telephone that script is up front and ready for pickup. 

## 2013-04-28 NOTE — Telephone Encounter (Signed)
Printed. I'll sign when I get to clinic.  Thanks.

## 2013-04-28 NOTE — Telephone Encounter (Signed)
Called to request three prescriptions (ie: 90 days supply) for Adderall 20 mg Dispense #60; sig: 1 po BID.  Used last tablet 04/28/13. Reports Rx last filled 03/28/13.  Please call [phone] when Rx  ready for pick up.

## 2013-04-29 ENCOUNTER — Encounter: Payer: Self-pay | Admitting: Family Medicine

## 2013-05-17 ENCOUNTER — Encounter: Payer: Self-pay | Admitting: Family Medicine

## 2013-06-09 ENCOUNTER — Telehealth: Payer: Self-pay

## 2013-06-09 NOTE — Telephone Encounter (Signed)
Pt received EOB for urine ck done by [facility];advised pt needs to contact [facility] [phone]. Pt said got EOB and test done out of network and pt thinks will owe $ 850.00. Pt wants to know what 7 test were done. Pt said he did not authorize the testing but felt his prescription was being held hostage unless he did test.Pt will contact [facility] but request cb from office manager.

## 2013-10-14 ENCOUNTER — Other Ambulatory Visit: Payer: Self-pay

## 2014-08-25 ENCOUNTER — Encounter: Payer: Self-pay | Admitting: Family Medicine

## 2014-08-25 ENCOUNTER — Ambulatory Visit (INDEPENDENT_AMBULATORY_CARE_PROVIDER_SITE_OTHER): Payer: BC Managed Care – PPO | Admitting: Family Medicine

## 2014-08-25 VITALS — BP 104/68 | HR 77 | Temp 97.5°F | Wt 225.2 lb

## 2014-08-25 DIAGNOSIS — F988 Other specified behavioral and emotional disorders with onset usually occurring in childhood and adolescence: Secondary | ICD-10-CM

## 2014-08-25 DIAGNOSIS — R220 Localized swelling, mass and lump, head: Secondary | ICD-10-CM

## 2014-08-25 DIAGNOSIS — Z23 Encounter for immunization: Secondary | ICD-10-CM

## 2014-08-25 DIAGNOSIS — R071 Chest pain on breathing: Secondary | ICD-10-CM

## 2014-08-25 DIAGNOSIS — R221 Localized swelling, mass and lump, neck: Secondary | ICD-10-CM

## 2014-08-25 DIAGNOSIS — R0789 Other chest pain: Secondary | ICD-10-CM

## 2014-08-25 DIAGNOSIS — J9801 Acute bronchospasm: Secondary | ICD-10-CM

## 2014-08-25 MED ORDER — AMPHETAMINE-DEXTROAMPHETAMINE 20 MG PO TABS: 10.0000 mg | ORAL_TABLET | Freq: Every day | ORAL | Status: DC

## 2014-08-25 MED ORDER — ALBUTEROL SULFATE HFA 108 (90 BASE) MCG/ACT IN AERS: 2.0000 | INHALATION_SPRAY | Freq: Four times a day (QID) | RESPIRATORY_TRACT | Status: DC | PRN

## 2014-08-25 NOTE — Patient Instructions (Signed)
Restart the adderall and notify me if not better on 10-20mg  a day. Start with  a day for about 1 week, at least.  I'll check on the laughter-induced breathing changes.   The chest wall pain should get better slowly.  Take care.

## 2014-08-25 NOTE — Progress Notes (Signed)
Pre visit review using our clinic review tool, if applicable. No additional management support is needed unless otherwise documented below in the visit note.  Had ENT f/u re: neck mass, per patient was an unremarkable LN and didn't need f/u.    H/o ADD.  Off meds.  Wanted to get back on.  "The worst I've done.  Brain fog.  Can't keep things together and finish a task."  He is noting a change at work and his wife has noted it.  Sleeping okay.  He has some insomnia even off meds; the meds weren't likely the issue with insomnia prev.  Mood --> more irritable. No speeding tickets.  Weight is down, with diet and exercise. Exercise has helped his mood, when he can commit to it. His motivation is low.  No SI/HI.  Not depressed but annoyed with his concentration troubles.  He's trying to make lists, etc, but he is still having trouble with ADD sx.    He has a h/o asthma as a child. In the last year, has likely bronchospasm brought on by a hard laugh.  Better with SABA.  Laughter is the only thing that causes this.  He can feel it coming on.  Will get SOB, lasting about 30 sec.  Son with similar.  No wheeze o/w.  Asking about options.    Chest wall pain.  Pain with cough, palpation at the sternum.  No trauma.  No bruising.  Can her a pop in the chest near the sternum with stretching.   Meds, vitals, and allergies reviewed.   ROS: See HPI.  Otherwise, noncontributory.  GEN: nad, alert and oriented HEENT: mucous membranes moist NECK: supple w/o LA CV: rrr.  Chest wall ttp at the costochondral joints PULM: ctab, no inc wob ABD: soft, +bs EXT: no edema SKIN: no acute rash CN 2-12 wnl B, S/S/DTR wnl x4

## 2014-08-26 DIAGNOSIS — R0789 Other chest pain: Secondary | ICD-10-CM | POA: Insufficient documentation

## 2014-08-26 DIAGNOSIS — J9801 Acute bronchospasm: Secondary | ICD-10-CM | POA: Insufficient documentation

## 2014-08-26 NOTE — Assessment & Plan Note (Signed)
Likely dx with laughter trigger.  Normal neck exam, no stridor.  ctab today.  Use saba prn and I'll look into other options in the meantime.  He agrees.

## 2014-08-26 NOTE — Assessment & Plan Note (Addendum)
Restart adderall at , up to  if needed.  He agrees.  Will update me. 3 rxs done at OV.  Routine cautions. No illicits.  >25 minutes spent in face to face time with patient, >50% spent in counselling or coordination of care.

## 2014-08-26 NOTE — Assessment & Plan Note (Signed)
Should resolve, d/w pt about anatomy and limiting ROM- can apply pressure externally during a cough.

## 2014-08-26 NOTE — Assessment & Plan Note (Signed)
With prev ENT eval.

## 2014-09-05 ENCOUNTER — Telehealth: Payer: Self-pay | Admitting: Family Medicine

## 2014-09-05 NOTE — Telephone Encounter (Signed)
Notify pt.  I did some checking on this, it took longer than I anticipated and I apologize. Since his sx are episodic, I wouldn't change his meds at this point.  I would continue with PRN SABA use.  If needed frequently, we'll need to consider a med change.  Have him update [place]- see how he is doing in the meantime. Thanks.

## 2014-09-06 NOTE — Telephone Encounter (Signed)
Patient notified as instructed by telephone. Patient stated that he has used the Albuterol once or twice and it seems to be working okay. Patient stated that he has been taking Adderall 10 mg two times a day and it does not seem to be working much at all.  In the past he has taken Adderall 40 mg once a day and does not want to go on that strong of a dose again. Patient thinks taking 15 mg two times a day may work.

## 2014-09-06 NOTE — Telephone Encounter (Signed)
Noted, thanks. I would try taking 20mg  in Am and 10mg  later in the day to equal 30mg . See how that does and let me know. That way he can use the pills he has.  We can adjust the dose/pill with the next rx. Thanks.

## 2014-09-07 NOTE — Telephone Encounter (Signed)
Patient notified as instructed by telephone. Patient stated that he will call with an update after he tries the adjustment on the medication.

## 2014-09-17 IMAGING — CT CT NECK WITH CONTRAST
1 of 2 series · 10 of 14 positions shown, 13 images · IV contrast (agent unspecified)
Comparison: none

REASON FOR EXAM: enlarged lymphnode
COMMENTS:

PROCEDURE:     CT  - CT NECK WITH CONTRAST  - March 19, 2013  [DATE]
RESULT:     Comparison: None.
TECHNIQUE: Multiple axial images were obtained of the neck, after the
ministration of 70 mL of 1sovue-CW4 intravenous contrast.

[Series 2: soft tissue · axial · 0.51mm/px · z∈[-297,-78]mm · 10 of 91 slices shown, 13 images]
[im 9/91  soft-tissue]
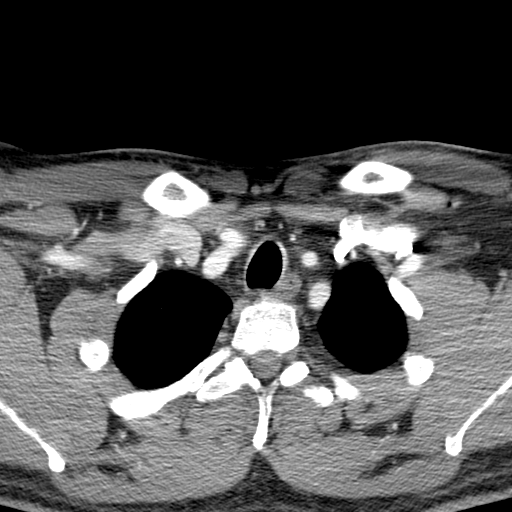
[im 9/91  bone]
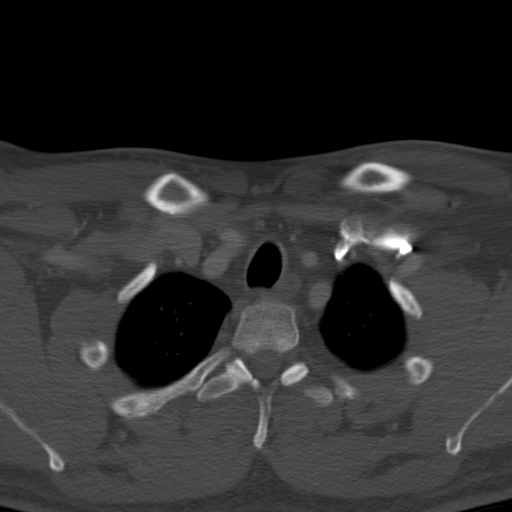
[im 17/91  bone]
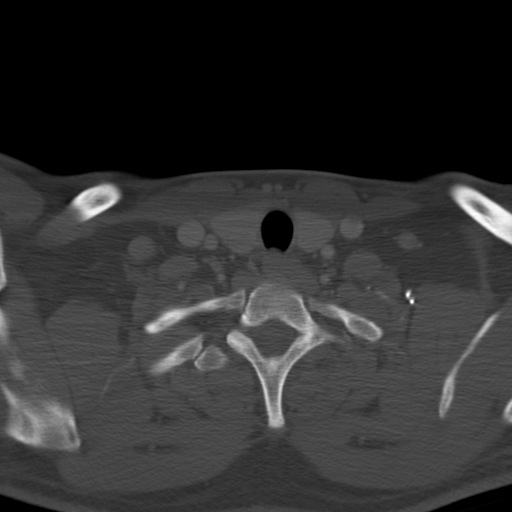
[im 25/91  bone]
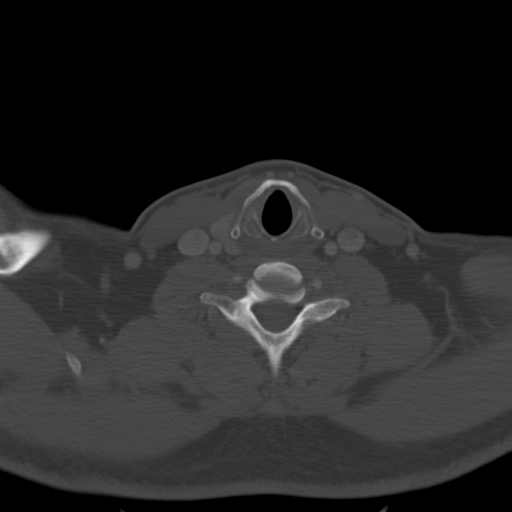
[im 33/91  bone]
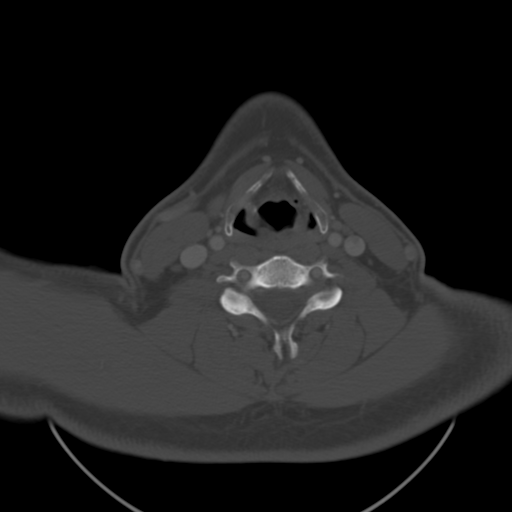
[im 41/91  soft-tissue]
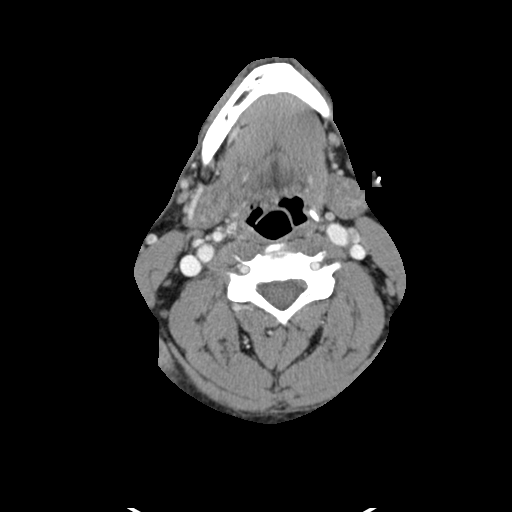
[im 41/91  bone]
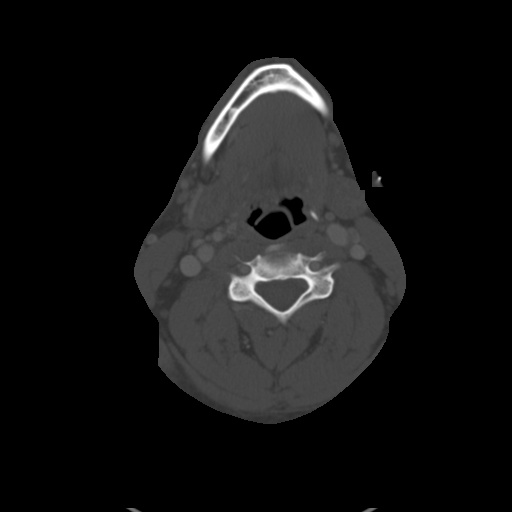
[im 50/91  bone]
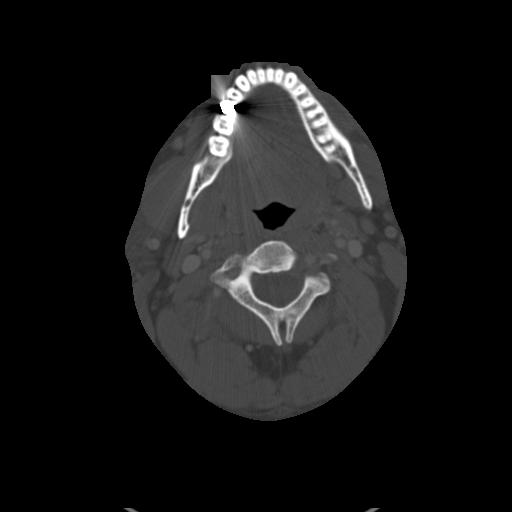
[im 58/91  bone]
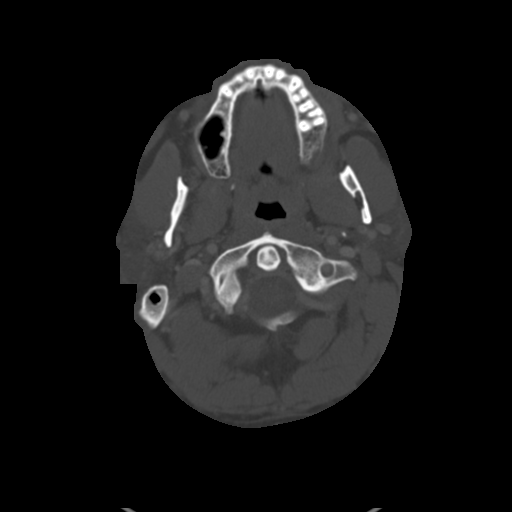
[im 66/91  bone]
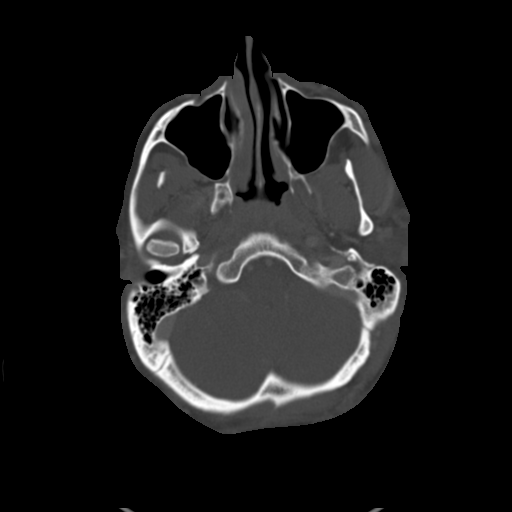
[im 74/91  soft-tissue]
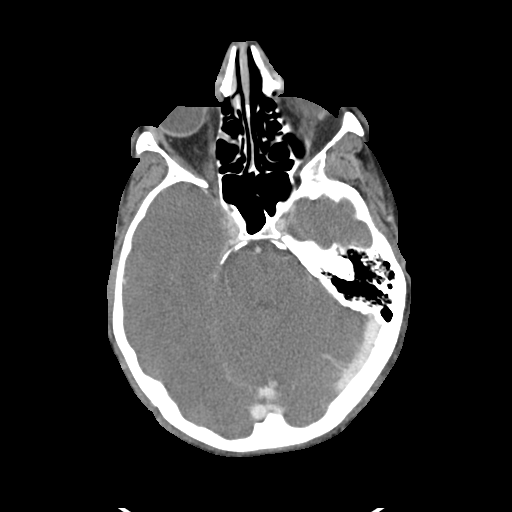
[im 74/91  bone]
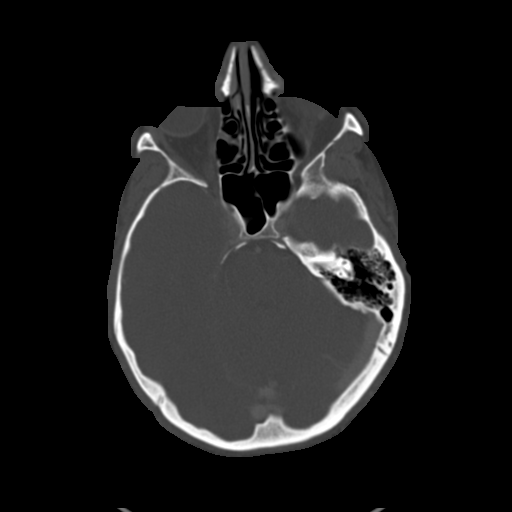
[im 82/91  bone]
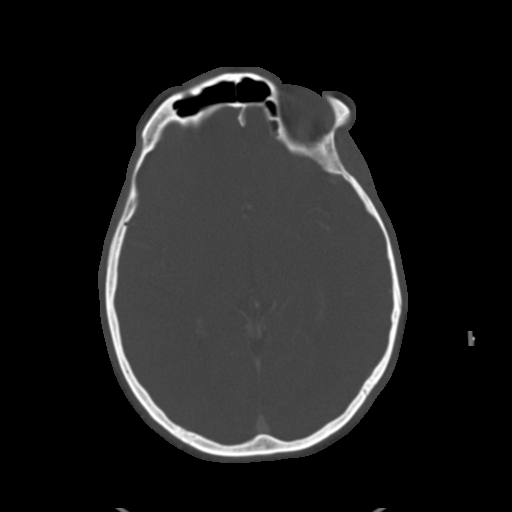

[10 of 14 positions shown; findings below may reference images not displayed]

FINDINGS: A marker was placed along the left side of the neck at the site of reported
palpable abnormality. This is at the level of the left submandibular
salivary gland. There is a small lymph node with a fatty hilum in this
region, which is subcentimeter in short axis. No lymphadenopathy identified
in the neck. There are small, subcentimeter round masses in the subcutaneous
fat along the posterior aspect of the right and left neck, at the level of
the inferior aspect of the occipital skull. These are nonspecific and may
represent small lymph nodes.

No aggressive lytic or sclerotic osseous lesions are identified.
IMPRESSION: No lymphadenopathy identified in the neck. The palpable marker is at the
level of the left mandibular gland.

## 2014-09-19 MED ORDER — AMPHETAMINE-DEXTROAMPHETAMINE 15 MG PO TABS: 15.0000 mg | ORAL_TABLET | Freq: Two times a day (BID) | ORAL | Status: DC

## 2014-09-19 NOTE — Telephone Encounter (Signed)
Patient notified Rx ready for pick up. Patient verbalized understanding. 

## 2014-09-19 NOTE — Addendum Note (Signed)
Addended by: Joaquim NamUNCAN, Zanden Colver S on: 09/19/2014 02:09 PM   Modules accepted: Orders

## 2014-09-19 NOTE — Telephone Encounter (Signed)
Called pt.  Change to 15mg  BID to total 30mg  per day.  rxs printed.  He'll turn in the old rxs. Thanks.

## 2014-09-19 NOTE — Telephone Encounter (Signed)
Pt left v/m; pt requesting rx for adderall; pt has been taking 30 mg daily. I do not see adderall 10 mg on med list.pt has one pill left and request to pick up rx ASAP.Please advise.

## 2014-12-29 ENCOUNTER — Telehealth: Payer: Self-pay | Admitting: Family Medicine
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
## 2015-01-02 MED ORDER — AMPHETAMINE-DEXTROAMPHETAMINE 30 MG PO TABS: 30.0000 mg | ORAL_TABLET | Freq: Two times a day (BID) | ORAL | Status: DC

## 2015-01-02 NOTE — Telephone Encounter (Signed)
Patient advised.  Rx left at front desk for pick up. 

## 2015-01-02 NOTE — Telephone Encounter (Signed)
Pt called office this am, he is taking 20 mg bid, and he does want to increase to 30 mg bid. Pt request cb when rx is ready, he is out of med.

## 2015-01-02 NOTE — Telephone Encounter (Signed)
Patient called back to see if his rx is ready.  I made patient a follow up appoinment on 01/23/15.  Please call patient when rx is ready at [phone].  If patient doesn't answer, please leave a detailed message.

## 2015-01-02 NOTE — Telephone Encounter (Signed)
Printed, but I want to see him in about 2 weeks to check on this, f/u BP, etc.  Thanks.

## 2015-01-23 ENCOUNTER — Ambulatory Visit: Payer: Self-pay | Admitting: Family Medicine

## 2015-02-10 ENCOUNTER — Encounter: Payer: Self-pay | Admitting: Family Medicine

## 2015-02-10 ENCOUNTER — Ambulatory Visit (INDEPENDENT_AMBULATORY_CARE_PROVIDER_SITE_OTHER): Payer: BLUE CROSS/BLUE SHIELD | Admitting: Family Medicine

## 2015-02-10 VITALS — BP 110/70 | HR 82 | Temp 98.2°F | Wt 222.2 lb

## 2015-02-10 DIAGNOSIS — F988 Other specified behavioral and emotional disorders with onset usually occurring in childhood and adolescence: Secondary | ICD-10-CM

## 2015-02-10 DIAGNOSIS — F909 Attention-deficit hyperactivity disorder, unspecified type: Secondary | ICD-10-CM

## 2015-02-10 MED ORDER — AMPHETAMINE-DEXTROAMPHETAMINE 30 MG PO TABS: 30.0000 mg | ORAL_TABLET | Freq: Two times a day (BID) | ORAL | Status: DC

## 2015-02-10 NOTE — Progress Notes (Signed)
Pre visit review using our clinic review tool, if applicable. No additional management support is needed unless otherwise documented below in the visit note.  He hasn't had recent need for SABA recently.  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    ADD f/u.  Much improved on higher dose of med.  Much more on task. No insomnia, appetite is still good.  No tremor.  Mood is good.  He has some days rarely where he doesn't need the second dose.  He does take it on the weekends.  His wife noted an improvement overall.  No ADE on meds.    Meds, vitals, and allergies reviewed.   ROS: See HPI.  Otherwise, noncontributory.  GEN: nad, alert and oriented HEENT: mucous membranes moist NECK: supple w/o LA CV: rrr PULM: ctab, no inc wob ABD: soft, +bs EXT: no edema SKIN: no acute rash

## 2015-02-10 NOTE — Patient Instructions (Signed)
Take care.  Glad to see you.  Let me know when you need a refill on the adderall.

## 2015-02-12 NOTE — Assessment & Plan Note (Signed)
Much improved on higher dose of med. Much more on task. No insomnia, appetite is still good. No tremor. Mood is good. He has some days rarely where he doesn't need the second dose. Continue as is.  He agrees.

## 2015-06-01 ENCOUNTER — Other Ambulatory Visit: Payer: Self-pay | Admitting: Family Medicine

## 2015-06-01 ENCOUNTER — Encounter: Payer: Self-pay | Admitting: Family Medicine

## 2015-06-01 MED ORDER — AMPHETAMINE-DEXTROAMPHETAMINE 30 MG PO TABS: 30.0000 mg | ORAL_TABLET | Freq: Two times a day (BID) | ORAL | Status: DC

## 2015-06-01 NOTE — Telephone Encounter (Signed)
MyChart Refill Request

## 2015-06-01 NOTE — Telephone Encounter (Signed)
Patient advised.  Rx left at front desk for pick up. 

## 2015-06-01 NOTE — Telephone Encounter (Signed)
Printed.  Thanks.  

## 2015-09-15 ENCOUNTER — Other Ambulatory Visit: Payer: Self-pay | Admitting: Family Medicine

## 2015-09-15 ENCOUNTER — Encounter: Payer: Self-pay | Admitting: Family Medicine

## 2015-09-15 MED ORDER — AMPHETAMINE-DEXTROAMPHETAMINE 30 MG PO TABS: 30.0000 mg | ORAL_TABLET | Freq: Two times a day (BID) | ORAL | Status: DC

## 2015-09-15 NOTE — Progress Notes (Unsigned)
rxs printed, please put up front for patient.  I sent patient mychart message. Thanks.

## 2015-09-18 ENCOUNTER — Encounter: Payer: Self-pay | Admitting: Family Medicine

## 2015-12-26 ENCOUNTER — Other Ambulatory Visit: Payer: Self-pay | Admitting: *Deleted

## 2015-12-26 NOTE — Telephone Encounter (Signed)
Last filled #60 x3 scripts.  Patient requesting refills x3.  Last evaluated 02/10/15.

## 2015-12-27 MED ORDER — AMPHETAMINE-DEXTROAMPHETAMINE 30 MG PO TABS
30.0000 mg | ORAL_TABLET | Freq: Two times a day (BID) | ORAL | Status: DC
Start: 2015-12-27 — End: 2016-04-24

## 2015-12-27 MED ORDER — AMPHETAMINE-DEXTROAMPHETAMINE 30 MG PO TABS: 30.0000 mg | ORAL_TABLET | Freq: Two times a day (BID) | ORAL | Status: DC

## 2015-12-27 NOTE — Telephone Encounter (Signed)
Patient notified as instructed by telephone and verbalized understanding. Patient aware that script is up front ready for pickup and will schedule appointment.

## 2015-12-27 NOTE — Telephone Encounter (Signed)
Printed.  Will sign when I get to clinic.  Thanks.  Needs ADD f/u this summer.  Thanks.

## 2016-01-30 ENCOUNTER — Other Ambulatory Visit: Payer: Self-pay | Admitting: *Deleted

## 2016-01-30 NOTE — Telephone Encounter (Signed)
Faxed refill request. Please advise.  

## 2016-01-31 ENCOUNTER — Other Ambulatory Visit: Payer: Self-pay | Admitting: *Deleted

## 2016-01-31 NOTE — Telephone Encounter (Signed)
Received faxed form from pharmacy for prior authorization on D-Amphetamine. See contact information on request.

## 2016-01-31 NOTE — Telephone Encounter (Signed)
Request came from [facility] [phone]

## 2016-02-01 NOTE — Telephone Encounter (Signed)
PA submitted thru CMM with immediate approval.  Patient notified.

## 2016-02-01 NOTE — Telephone Encounter (Signed)
Pt left v/m requesting cb ASAP about status of adderall PA; pt said he has been without med for 4 days.

## 2016-04-24 ENCOUNTER — Other Ambulatory Visit: Payer: Self-pay | Admitting: *Deleted

## 2016-04-24 MED ORDER — AMPHETAMINE-DEXTROAMPHETAMINE 30 MG PO TABS: 30.0000 mg | ORAL_TABLET | Freq: Two times a day (BID) | ORAL | Status: DC

## 2016-04-24 NOTE — Telephone Encounter (Signed)
Printed.  Thanks. Has f/u OV pending.  

## 2016-04-24 NOTE — Telephone Encounter (Signed)
Pt left voicemail requesting 3 months worth of meds (how Dr. Para Marchuncan did last time), pt reqeusting call back when ready, pt said he is completely out of med and request refill asap

## 2016-04-24 NOTE — Telephone Encounter (Signed)
Patient notified by telephone that script is up front ready for pickup. 

## 2016-06-17 ENCOUNTER — Ambulatory Visit (INDEPENDENT_AMBULATORY_CARE_PROVIDER_SITE_OTHER): Payer: BLUE CROSS/BLUE SHIELD | Admitting: Family Medicine

## 2016-06-17 ENCOUNTER — Encounter (INDEPENDENT_AMBULATORY_CARE_PROVIDER_SITE_OTHER): Payer: Self-pay

## 2016-06-17 ENCOUNTER — Encounter: Payer: Self-pay | Admitting: Family Medicine

## 2016-06-17 VITALS — BP 132/100 | HR 89 | Temp 98.4°F | Wt 235.2 lb

## 2016-06-17 DIAGNOSIS — F909 Attention-deficit hyperactivity disorder, unspecified type: Secondary | ICD-10-CM

## 2016-06-17 DIAGNOSIS — S61412D Laceration without foreign body of left hand, subsequent encounter: Secondary | ICD-10-CM

## 2016-06-17 DIAGNOSIS — F988 Other specified behavioral and emotional disorders with onset usually occurring in childhood and adolescence: Secondary | ICD-10-CM

## 2016-06-17 NOTE — Progress Notes (Signed)
Pre visit review using our clinic review tool, if applicable. No additional management support is needed unless otherwise documented below in the visit note.  Sharp, clean steel blade hit L hand, then sewed shut yesterday at UC.  Painful still.  Bandaged.  No motor deficit.  Sensation intact.  No drainage.    ADD f/u.  No ADE on meds.  Better concentration on med.  Did worse with work off med.  Wants to continue.  No insomnia, no appetite suppression.    Meds, vitals, and allergies reviewed.   ROS: Per HPI unless specifically indicated in ROS section   GEN: nad, alert and oriented Affect wnl NECK: supple w/o LA CV: rrr.  PULM: ctab, no inc wob EXT: no edema L palm with clean suture line in the palm.  No motor deficit.  Sensation intact.

## 2016-06-17 NOTE — Patient Instructions (Signed)
Update me as needed.  Stitches out in about 9 days.   Keep hand clean and dry.   Get the ring off tonight, after elevating your hand.   Take care.  Glad to see you.  Let me know when you need refills.

## 2016-06-18 DIAGNOSIS — S61419A Laceration without foreign body of unspecified hand, initial encounter: Secondary | ICD-10-CM | POA: Insufficient documentation

## 2016-06-18 NOTE — Assessment & Plan Note (Addendum)
Continue as is.  No change in meds.  He agrees.  Clearly improved on meds, did worse off med. He'll check BP a few times out of clinic and update me as needed.

## 2016-06-18 NOTE — Assessment & Plan Note (Signed)
D/w pt about routine care.  He should have sutures removed in about 9 days.  Rebandaged at OV.

## 2016-08-23 ENCOUNTER — Other Ambulatory Visit: Payer: Self-pay | Admitting: Family Medicine

## 2016-08-23 ENCOUNTER — Encounter: Payer: Self-pay | Admitting: Family Medicine

## 2016-08-23 MED ORDER — AMPHETAMINE-DEXTROAMPHETAMINE 30 MG PO TABS: 30.0000 mg | ORAL_TABLET | Freq: Two times a day (BID) | ORAL | 0 refills | Status: DC

## 2016-08-23 NOTE — Progress Notes (Signed)
rx printed. I sent mychart message to patient to pick up.  Thanks.

## 2016-12-06 ENCOUNTER — Encounter: Payer: Self-pay | Admitting: Family Medicine

## 2016-12-08 ENCOUNTER — Other Ambulatory Visit: Payer: Self-pay | Admitting: Family Medicine

## 2016-12-08 MED ORDER — AMPHETAMINE-DEXTROAMPHETAMINE 30 MG PO TABS: 30.0000 mg | ORAL_TABLET | Freq: Two times a day (BID) | ORAL | 0 refills | Status: DC

## 2016-12-10 NOTE — Telephone Encounter (Signed)
Patient advised.  Rx left at front desk for pick up. 

## 2017-03-13 ENCOUNTER — Encounter: Payer: Self-pay | Admitting: Family Medicine

## 2017-03-14 ENCOUNTER — Other Ambulatory Visit: Payer: Self-pay | Admitting: Family Medicine

## 2017-03-14 ENCOUNTER — Encounter: Payer: Self-pay | Admitting: Family Medicine

## 2017-03-14 MED ORDER — AMPHETAMINE-DEXTROAMPHETAMINE 30 MG PO TABS: 30.0000 mg | ORAL_TABLET | Freq: Two times a day (BID) | ORAL | 0 refills | Status: DC

## 2017-03-14 NOTE — Progress Notes (Signed)
rx printed.  Thanks.  

## 2017-06-23 ENCOUNTER — Encounter: Payer: Self-pay | Admitting: Family Medicine

## 2017-06-24 ENCOUNTER — Other Ambulatory Visit: Payer: Self-pay | Admitting: Family Medicine

## 2017-06-24 MED ORDER — AMPHETAMINE-DEXTROAMPHETAMINE 30 MG PO TABS: 30.0000 mg | ORAL_TABLET | Freq: Two times a day (BID) | ORAL | 0 refills | Status: DC

## 2017-07-14 ENCOUNTER — Ambulatory Visit (INDEPENDENT_AMBULATORY_CARE_PROVIDER_SITE_OTHER): Payer: BLUE CROSS/BLUE SHIELD | Admitting: Family Medicine

## 2017-07-14 ENCOUNTER — Encounter: Payer: Self-pay | Admitting: Family Medicine

## 2017-07-14 VITALS — BP 122/90 | HR 89 | Temp 98.1°F | Wt 236.8 lb

## 2017-07-14 DIAGNOSIS — R0683 Snoring: Secondary | ICD-10-CM | POA: Diagnosis not present

## 2017-07-14 DIAGNOSIS — Z202 Contact with and (suspected) exposure to infections with a predominantly sexual mode of transmission: Secondary | ICD-10-CM | POA: Diagnosis not present

## 2017-07-14 DIAGNOSIS — R0789 Other chest pain: Secondary | ICD-10-CM | POA: Diagnosis not present

## 2017-07-14 LAB — CBC WITH DIFFERENTIAL/PLATELET
BASOS PCT: 0 %
Basophils Absolute: 0 cells/uL (ref 0–200)
Eosinophils Absolute: 58 cells/uL (ref 15–500)
Eosinophils Relative: 1 %
HEMATOCRIT: 48.8 % (ref 38.5–50.0)
HEMOGLOBIN: 17.1 g/dL (ref 13.2–17.1)
LYMPHS ABS: 2436 {cells}/uL (ref 850–3900)
Lymphocytes Relative: 42 %
MCH: 30.8 pg (ref 27.0–33.0)
MCHC: 35 g/dL (ref 32.0–36.0)
MCV: 87.8 fL (ref 80.0–100.0)
MONO ABS: 464 {cells}/uL (ref 200–950)
MPV: 9.9 fL (ref 7.5–12.5)
Monocytes Relative: 8 %
Neutro Abs: 2842 cells/uL (ref 1500–7800)
Neutrophils Relative %: 49 %
Platelets: 176 10*3/uL (ref 140–400)
RBC: 5.56 MIL/uL (ref 4.20–5.80)
RDW: 13.3 % (ref 11.0–15.0)
WBC: 5.8 10*3/uL (ref 3.8–10.8)

## 2017-07-14 MED ORDER — RANITIDINE HCL 150 MG PO TABS: 150.0000 mg | ORAL_TABLET | Freq: Two times a day (BID) | ORAL | Status: DC | PRN

## 2017-07-14 NOTE — Patient Instructions (Addendum)
Ask the front at check out about your referral to pulmonary.  Go to the lab on the way out.  We'll contact you with your lab report. Take care.  Glad to see you.  Cut back/out sodas and caffeine.  Likely reasonable to add on zantac 150mg  1-2 times a day in the meantime.

## 2017-07-14 NOTE — Progress Notes (Signed)
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  Also with h/o heavy sweating in the past at baseline, even w/o adderall use.    [place] home for a few days now.  Sweating was worse there.  Some abd pain and atypical L chest pain at rest.  Started in a restaurant at rest while on vacation.  Intermittent in the meantime, wax and wane, sometimes w/o sx at all.  Pain when present is medial and lateral the L nipple. But not ttp locally.  No rash.  No vomiting.  Some heartburn at baseline, occ.  Some diarrhea at baseline.  No fevers.  No one else is sick at home.  No h/o CAD.  Walking around the park, he didn't have exertional sx.  He still doesn't have exertional sx.    He cut back some on sodas in the meantime with some improvement but still drinking some pepsi.    Snoring, can wake gasping for air.    Patient wanted HIV screen done.   PMH and SH reviewed  ROS: Per HPI unless specifically indicated in ROS section   Meds, vitals, and allergies reviewed.   GEN: nad, alert and oriented HEENT: mucous membranes moist NECK: supple w/o LA, 18" neck.   CV: rrr.  no murmur PULM: ctab, no inc wob ABD: soft, +bs EXT: no edema SKIN: no acute rash

## 2017-07-15 DIAGNOSIS — R0789 Other chest pain: Secondary | ICD-10-CM | POA: Insufficient documentation

## 2017-07-15 LAB — BASIC METABOLIC PANEL
BUN: 10 mg/dL (ref 7–25)
CHLORIDE: 104 mmol/L (ref 98–110)
CO2: 24 mmol/L (ref 20–32)
Calcium: 9.6 mg/dL (ref 8.6–10.3)
Creat: 1.03 mg/dL (ref 0.60–1.35)
Glucose, Bld: 70 mg/dL (ref 65–99)
POTASSIUM: 4.5 mmol/L (ref 3.5–5.3)
SODIUM: 141 mmol/L (ref 135–146)

## 2017-07-15 LAB — HIV ANTIBODY (ROUTINE TESTING W REFLEX): HIV: NONREACTIVE

## 2017-07-15 LAB — TSH: TSH: 1.8 mIU/L (ref 0.40–4.50)

## 2017-07-15 NOTE — Assessment & Plan Note (Signed)
EKG d/w pt, no acute changes.  Possible PVCs noted by patient. Needs to cut back soda and caffeine, d/w pt.  Possible GERD component.  Add on zantac, check routine labs today given possible relative dehydration while he was on vacation.  I am not concerned for ischemic or ominous event.  D/w pt.  He agrees.   Could also have OSA and that could contribute to PVCs.  Needs weight loss, refer to pulmonary.  He agrees.   OSA path/phys d/w pt.  >25 minutes spent in face to face time with patient, >50% spent in counselling or coordination of care.

## 2017-08-12 ENCOUNTER — Encounter: Payer: Self-pay | Admitting: Pulmonary Disease

## 2017-08-12 ENCOUNTER — Ambulatory Visit (INDEPENDENT_AMBULATORY_CARE_PROVIDER_SITE_OTHER): Payer: BLUE CROSS/BLUE SHIELD | Admitting: Pulmonary Disease

## 2017-08-12 VITALS — BP 160/98 | HR 116 | Ht 74.0 in | Wt 236.0 lb

## 2017-08-12 DIAGNOSIS — R0683 Snoring: Secondary | ICD-10-CM

## 2017-08-12 DIAGNOSIS — F5104 Psychophysiologic insomnia: Secondary | ICD-10-CM | POA: Diagnosis not present

## 2017-08-12 NOTE — Patient Instructions (Signed)
Sleep study has been ordered  We will contact you after the results are available to discuss further management and to schedule follow-up  Suggested melatonin (1-3 mg) one hour prior to bedtime for insomnia

## 2017-08-13 NOTE — Progress Notes (Signed)
PULMONARY/SLEEP CONSULT NOTE  Requesting MD/Service: Crawford Givens, MD Date of initial consultation: 08/12/17 Reason for consultation: Heavy snoring, suspected OSA  PT PROFILE: 41 y.o. male never smoker referred for eval of heavy snoring and witnessed apneas  HPI:  As above. He has a long history of heavy snoring and his wife has witnessed apneas. His sleep habits are poor, sometimes going to sleep @ 1 AM or later and sleeping less than 6 hrs per night. He has minimal daytime sleepiness with an Epworth sleepiness scale score of only 3. However, he takes Adderall for ADD which might be masking sleep deprivation. His sleep onset latency is variable, from 15 mins to > one hr. He has significant personal stressors contributing to sleep onset insomnia (these were touched on but not investigated in detail as they involve his teenage daughter).   Past Medical History:  Diagnosis Date  . ADHD (attention deficit hyperactivity disorder)   . GERD (gastroesophageal reflux disease)   . Meningitis, viral    2004    Past Surgical History:  Procedure Laterality Date  . MENISECTOMY     left knee and plica removed    MEDICATIONS: I have reviewed all medications and confirmed regimen as documented  Social History   Social History  . Marital status: Married    Spouse name: N/A  . Number of children: 1  . Years of education: N/A   Occupational History  . network engineer [employer]   Social History Main Topics  . Smoking status: Former Games developer  . Smokeless tobacco: Former Neurosurgeon     Comment: quit 2006  . Alcohol use Yes     Comment: very rarely  . Drug use: No  . Sexual activity: Not on file   Other Topics Concern  . Not on file   Social History Narrative   From [place]   Enjoys welding/fabrications   Working at [employer]   1 daughter, 1 son    Family History  Problem Relation Age of Onset  . ADD / ADHD Mother   . Celiac disease Mother   . Heart attack Maternal  Grandmother     ROS: No fever, myalgias/arthralgias, unexplained weight loss or weight gain No new focal weakness or sensory deficits No otalgia, hearing loss, visual changes, nasal and sinus symptoms, mouth and throat problems No neck pain or adenopathy No abdominal pain, N/V/D, diarrhea, change in bowel pattern No dysuria, change in urinary pattern   Vitals:   08/12/17 1454  BP: (!) 160/98  Pulse: (!) 116  SpO2: 98%  Weight: 107 kg (236 lb)  Height: 6\' 2"  (1.88 m)     EXAM:  Gen: WDWN, No overt respiratory distress HEENT: NCAT, sclera white, oropharynx normal Neck: Supple without LAN, thyromegaly, JVD Lungs: breath sounds full without adventitious sounds Cardiovascular: RRR, no murmurs noted Abdomen: Soft, nontender, normal BS Ext: without clubbing, cyanosis, edema Neuro: CNs grossly intact, motor and sensory intact Skin: Limited exam, no lesions noted  DATA:   BMP Latest Ref Rng & Units 07/14/2017 10/28/2012 07/19/2011  Glucose 65 - 99 mg/dL 70 161(W) 960(A)  BUN 7 - 25 mg/dL 10 - -  Creatinine 5.40 - 1.35 mg/dL 9.81 - -  Sodium 191 - 146 mmol/L 141 - -  Potassium 3.5 - 5.3 mmol/L 4.5 - -  Chloride 98 - 110 mmol/L 104 - -  CO2 20 - 32 mmol/L 24 - -  Calcium 8.6 - 10.3 mg/dL 9.6 - -    CBC  Latest Ref Rng & Units 07/14/2017 12/17/2012 10/28/2012  WBC 3.8 - 10.8 K/uL 5.8 6.4 -  Hemoglobin 13.2 - 17.1 g/dL 96.017.1 17.5(H) 17.4(H)  Hematocrit 38.5 - 50.0 % 48.8 50.9 -  Platelets 140 - 400 K/uL 176 176.0 -    CXR:  None available  IMPRESSION:     ICD-10-CM   1. Snoring R06.83 Split night study  2. Psychophysiological insomnia F51.04 Split night study     PLAN:  Home sleep study ordered Suggested Melatonin 1-3 mg qHS one hour before planned sleep Discussed sleep hygiene We will contact him after sleep studies results are available to initiate therapy if appropriate and arrange follow up   Billy Fischeravid Daneisha Surges, MD PCCM service Mobile [phone] Pager  [phone] 08/13/2017 9:09 PM

## 2017-10-15 ENCOUNTER — Encounter: Payer: Self-pay | Admitting: Family Medicine

## 2017-10-17 ENCOUNTER — Encounter: Payer: Self-pay | Admitting: Family Medicine

## 2017-10-17 ENCOUNTER — Other Ambulatory Visit: Payer: Self-pay | Admitting: *Deleted

## 2017-10-17 ENCOUNTER — Other Ambulatory Visit: Payer: Self-pay | Admitting: Family Medicine

## 2017-10-17 MED ORDER — AMPHETAMINE-DEXTROAMPHETAMINE 30 MG PO TABS: 30.0000 mg | ORAL_TABLET | Freq: Two times a day (BID) | ORAL | 0 refills | Status: DC

## 2017-10-17 MED ORDER — AMPHETAMINE-DEXTROAMPHETAMINE 30 MG PO TABS
30.0000 mg | ORAL_TABLET | Freq: Two times a day (BID) | ORAL | 0 refills | Status: DC
Start: 2017-10-17 — End: 2017-10-17

## 2017-10-17 NOTE — Progress Notes (Signed)
Attempted to print x3.  Let me know if this doesn't come off.   Thanks.   Printed and faxed.  Ninetta LightsL. Fuquay, CMA  10/17/17

## 2017-10-17 NOTE — Addendum Note (Signed)
Addended by: Annamarie MajorFUQUAY, Wendelyn Kiesling S on: 10/17/2017 10:02 AM   Modules accepted: Orders

## 2017-10-17 NOTE — Telephone Encounter (Signed)
Patient advised.  Rx left at front desk for pick up. 

## 2018-02-09 ENCOUNTER — Other Ambulatory Visit: Payer: Self-pay | Admitting: Family Medicine

## 2018-02-09 ENCOUNTER — Encounter: Payer: Self-pay | Admitting: Family Medicine

## 2018-02-09 MED ORDER — AMPHETAMINE-DEXTROAMPHETAMINE 30 MG PO TABS: 30.0000 mg | ORAL_TABLET | Freq: Two times a day (BID) | ORAL | 0 refills | Status: DC

## 2018-04-16 ENCOUNTER — Ambulatory Visit: Payer: Self-pay

## 2018-04-16 NOTE — Telephone Encounter (Signed)
Patient called in with c/o "nose swelling." He says "it started on Monday, not the swelling, but my nose was sore like a zit under the skin. Yesterday is when the swelling started and it is on the left side of my nose and it's starting to affect my breathing out that nostril. It is painful to touch, at a 6-7, and a 4 when I'm not touching it. The color is purplish tint, no warmth to it. I don't have a fever, it doesn't itch that much, but I have noticed a tad bit of itch every once in a while." According to protocol, see PCP within 24 hours, no availability with PCP or other providers in the practice, patient prefers not to be seen by Dr. Alphonsus Sias, requests to be seen at another location, appointment scheduled for tomorrow at 1315 with Rennie Plowman, NP at [facility], care advice given, patient verbalized understanding.  Reason for Disposition . Swelling is painful to touch  Answer Assessment - Initial Assessment Questions 1. ONSET: "When did the swelling start?" (e.g., minutes, hours, days)     Yesterday 2. LOCATION: "What part of the face is swollen?"     Nose-left side 3. SEVERITY: "How swollen is it?"     Unable to determine, overall swelling, 25% larger than right side 4. ITCHING: "Is there any itching?" If so, ask: "How much?"   (Scale 1-10; mild, moderate or severe)     Not really 5. PAIN: "Is the swelling painful to touch?" If so, ask: "How painful is it?"   (Scale 1-10; mild, moderate or severe)     6 when touched; 4 when not touched 6. FEVER: "Do you have a fever?" If so, ask: "What is it, how was it measured, and when did it start?"      No 7. CAUSE: "What do you think is causing the face swelling?"     I don't know 8. RECURRENT SYMPTOM: "Have you had face swelling before?" If so, ask: "When was the last time?" "What happened that time?"     No 9. OTHER SYMPTOMS: "Do you have any other symptoms?" (e.g., toothache, leg swelling)     Nose purplish tint to it 10. PREGNANCY:  "Is there any chance you are pregnant?" "When was your last menstrual period?"       N/A  Protocols used: [facility]

## 2018-04-17 ENCOUNTER — Ambulatory Visit: Payer: BLUE CROSS/BLUE SHIELD | Admitting: Internal Medicine

## 2018-04-17 ENCOUNTER — Encounter (INDEPENDENT_AMBULATORY_CARE_PROVIDER_SITE_OTHER): Payer: Self-pay

## 2018-04-17 ENCOUNTER — Ambulatory Visit: Payer: BLUE CROSS/BLUE SHIELD | Admitting: Family

## 2018-06-08 ENCOUNTER — Ambulatory Visit: Payer: BLUE CROSS/BLUE SHIELD | Admitting: Family Medicine

## 2018-06-08 ENCOUNTER — Encounter: Payer: Self-pay | Admitting: Family Medicine

## 2018-06-08 DIAGNOSIS — F988 Other specified behavioral and emotional disorders with onset usually occurring in childhood and adolescence: Secondary | ICD-10-CM | POA: Diagnosis not present

## 2018-06-08 MED ORDER — AMPHETAMINE-DEXTROAMPHETAMINE 30 MG PO TABS: 30.0000 mg | ORAL_TABLET | Freq: Two times a day (BID) | ORAL | 0 refills | Status: DC

## 2018-06-08 NOTE — Patient Instructions (Signed)
Recheck at a physical in about 6 months or when convenient for you.  Update me as needed.  Take care.  Glad to see you.

## 2018-06-08 NOTE — Progress Notes (Signed)
He didn't go through with sleep study since he was snoring less and feeling better.  I'll defer for now.  He agrees.  He'll update me as needed.    ADD Compliant with meds: yes benefit from med (ie increase in concentration): yes, "my brain quiets down and I'm able to get my job done."  Still worth taking per patient, even though he would like to avoid meds in general, just on principle.   change in mood:no change in appetite:no Insomnia:no tremor:no compliant with behavioral modification:yes  UDS as expected 2018.  Denies illicit use.    Mother aunt and grandmother all had attention troubles.  His son may have similar sx.  D/w pt.    ROS: Per HPI unless specifically indicated in ROS section  Meds, vitals, and allergies reviewed.   GEN: nad, alert and oriented, affect wnl and appropriate HEENT: mucous membranes moist NECK: supple w/o LA CV: rrr.  PULM: ctab, no inc wob ABD: soft, +bs EXT: no edema CN 2-12 wnl, s/s/dtr wnl B.  No tremor.

## 2018-06-10 NOTE — Assessment & Plan Note (Signed)
No change in meds.  Continue work on diet and exercise and routine healthy habits.  Update me as needed.  No adverse effect on medication.  All questions answered.

## 2018-09-24 ENCOUNTER — Encounter: Payer: Self-pay | Admitting: Family Medicine

## 2018-09-25 ENCOUNTER — Encounter: Payer: Self-pay | Admitting: Family Medicine

## 2018-09-25 NOTE — Telephone Encounter (Signed)
Dr. Para March,  I am just seeing this rx request now.  Appears he initially requested refill yesterday on his Adderall. He last received 3 months of R/X's for Adderall on 06/08/18 and is requesting refill.  He is stating that he will be out of medication this weekend and requests to be sent in.   Last UDS in 2018.  Uses [facility] on [address] in [place].  Please advise if okay to fill.  I did send him a my chart message alerting him that we need 72 hours notice for refills and this may not be done by Monday but he is concerned about running out and withdrawal.

## 2018-09-27 ENCOUNTER — Other Ambulatory Visit: Payer: Self-pay | Admitting: Family Medicine

## 2018-09-27 MED ORDER — AMPHETAMINE-DEXTROAMPHETAMINE 30 MG PO TABS: 30.0000 mg | ORAL_TABLET | Freq: Two times a day (BID) | ORAL | 0 refills | Status: DC

## 2018-11-19 DIAGNOSIS — J101 Influenza due to other identified influenza virus with other respiratory manifestations: Secondary | ICD-10-CM | POA: Diagnosis not present

## 2019-01-11 ENCOUNTER — Encounter: Payer: Self-pay | Admitting: Family Medicine

## 2019-01-12 ENCOUNTER — Other Ambulatory Visit: Payer: Self-pay | Admitting: Family Medicine

## 2019-01-12 MED ORDER — AMPHETAMINE-DEXTROAMPHETAMINE 30 MG PO TABS: 30.0000 mg | ORAL_TABLET | Freq: Two times a day (BID) | ORAL | 0 refills | Status: DC

## 2019-02-18 ENCOUNTER — Encounter: Payer: Self-pay | Admitting: Family Medicine

## 2019-04-19 DIAGNOSIS — Z03818 Encounter for observation for suspected exposure to other biological agents ruled out: Secondary | ICD-10-CM | POA: Diagnosis not present

## 2019-05-10 ENCOUNTER — Encounter: Payer: Self-pay | Admitting: Family Medicine

## 2019-05-11 ENCOUNTER — Other Ambulatory Visit: Payer: Self-pay | Admitting: Family Medicine

## 2019-05-11 MED ORDER — AMPHETAMINE-DEXTROAMPHETAMINE 30 MG PO TABS: 30.0000 mg | ORAL_TABLET | Freq: Two times a day (BID) | ORAL | 0 refills | Status: DC

## 2019-09-03 ENCOUNTER — Encounter: Payer: Self-pay | Admitting: Family Medicine

## 2019-09-05 ENCOUNTER — Other Ambulatory Visit: Payer: Self-pay | Admitting: Family Medicine

## 2019-09-05 MED ORDER — AMPHETAMINE-DEXTROAMPHETAMINE 30 MG PO TABS: 30.0000 mg | ORAL_TABLET | Freq: Two times a day (BID) | ORAL | 0 refills | Status: DC

## 2019-12-31 ENCOUNTER — Encounter: Payer: Self-pay | Admitting: Family Medicine

## 2020-01-04 ENCOUNTER — Other Ambulatory Visit: Payer: Self-pay

## 2020-01-04 NOTE — Telephone Encounter (Signed)
Adderall #60 last filled 09/05/2019 with 2 refills... I have advised pt that he needs to schedule a visit as it has been 1 1/2 years since he has been seen. Pt does not want to come into the office, so advised to schedule a video virtual visit...Marland Kitchen please advise

## 2020-01-05 MED ORDER — AMPHETAMINE-DEXTROAMPHETAMINE 30 MG PO TABS: 30.0000 mg | ORAL_TABLET | Freq: Two times a day (BID) | ORAL | 0 refills | Status: DC

## 2020-01-05 NOTE — Telephone Encounter (Signed)
Agreed.  Sent. Thanks.   

## 2020-02-14 ENCOUNTER — Telehealth: Payer: Self-pay

## 2020-02-14 NOTE — Telephone Encounter (Signed)
PA for Adderall done and approved for dates: 01/15/20-02/13/2021. Pharmacy advised through fax.

## 2020-03-02 ENCOUNTER — Ambulatory Visit: Payer: BC Managed Care – PPO | Attending: Internal Medicine

## 2020-03-02 DIAGNOSIS — Z23 Encounter for immunization: Secondary | ICD-10-CM

## 2020-03-02 NOTE — Progress Notes (Signed)
   Covid-19 Vaccination Clinic  Name:  Brandon Barr    MRN: [id number] DOB: [birth year]  03/02/2020  Mr. Wilson was observed post Covid-19 immunization for 15 minutes without incident. He was provided with Vaccine Information Sheet and instruction to access the V-Safe system.   Mr. Zeek was instructed to call 911 with any severe reactions post vaccine: Marland Kitchen Difficulty breathing  . Swelling of face and throat  . A fast heartbeat  . A bad rash all over body  . Dizziness and weakness   Immunizations Administered    Name Date Dose VIS Date Route   Pfizer COVID-19 Vaccine 03/02/2020  9:20 AM 0.3 mL 11/19/2019 Intramuscular   Manufacturer: [employer], [employer]   Lot: [id number]   NDC: [address]-7

## 2020-03-28 ENCOUNTER — Ambulatory Visit: Payer: BC Managed Care – PPO | Attending: Internal Medicine

## 2020-03-28 DIAGNOSIS — Z23 Encounter for immunization: Secondary | ICD-10-CM

## 2020-03-28 NOTE — Progress Notes (Signed)
   Covid-19 Vaccination Clinic  Name:  Brandon Barr    MRN: [id number] DOB: July 14, 1976  03/28/2020  [name] was observed post Covid-19 immunization for 15 minutes without incident. He was provided with Vaccine Information Sheet and instruction to access the V-Safe system.   [name] was instructed to call 911 with any severe reactions post vaccine: Marland Kitchen Difficulty breathing  . Swelling of face and throat  . A fast heartbeat  . A bad rash all over body  . Dizziness and weakness   Immunizations Administered    Name Date Dose VIS Date Route   Pfizer COVID-19 Vaccine 03/28/2020  8:59 AM 0.3 mL 02/02/2019 Intramuscular   Manufacturer: [employer], [employer]   Lot: [id number]   NDC: [address]-9

## 2020-05-01 ENCOUNTER — Ambulatory Visit: Payer: BC Managed Care – PPO | Admitting: Family Medicine

## 2020-05-01 ENCOUNTER — Encounter: Payer: Self-pay | Admitting: Family Medicine

## 2020-05-01 ENCOUNTER — Ambulatory Visit (INDEPENDENT_AMBULATORY_CARE_PROVIDER_SITE_OTHER)
Admission: RE | Admit: 2020-05-01 | Discharge: 2020-05-01 | Disposition: A | Discharge status: Home / Self Care | Payer: BC Managed Care – PPO | Source: Ambulatory Visit | Attending: Family Medicine | Admitting: Family Medicine

## 2020-05-01 ENCOUNTER — Other Ambulatory Visit: Payer: Self-pay

## 2020-05-01 VITALS — BP 120/90 | HR 75 | Temp 97.7°F | Ht 73.0 in | Wt 244.6 lb

## 2020-05-01 DIAGNOSIS — M25519 Pain in unspecified shoulder: Secondary | ICD-10-CM

## 2020-05-01 DIAGNOSIS — F988 Other specified behavioral and emotional disorders with onset usually occurring in childhood and adolescence: Secondary | ICD-10-CM

## 2020-05-01 DIAGNOSIS — Z13220 Encounter for screening for lipoid disorders: Secondary | ICD-10-CM

## 2020-05-01 LAB — LIPID PANEL
Cholesterol: 170 mg/dL (ref 0–200)
HDL: 42.1 mg/dL (ref >39.00)
LDL Cholesterol: 108 mg/dL — ABNORMAL HIGH (ref 0–99)
NonHDL: 127.47
Total CHOL/HDL Ratio: 4
Triglycerides: 96 mg/dL (ref 0.0–149.0)
VLDL: 19.2 mg/dL (ref 0.0–40.0)

## 2020-05-01 LAB — BASIC METABOLIC PANEL
BUN: 11 mg/dL (ref 6–23)
CO2: 31 mEq/L (ref 19–32)
Calcium: 9.6 mg/dL (ref 8.4–10.5)
Chloride: 103 mEq/L (ref 96–112)
Creatinine, Ser: 1.23 mg/dL (ref 0.40–1.50)
GFR: 64.32 mL/min (ref >60.00)
Glucose, Bld: 93 mg/dL (ref 70–99)
Potassium: 4.7 mEq/L (ref 3.5–5.1)
Sodium: 140 mEq/L (ref 135–145)

## 2020-05-01 LAB — CBC WITH DIFFERENTIAL/PLATELET
Basophils Absolute: 0 10*3/uL (ref 0.0–0.1)
Basophils Relative: 0.3 % (ref 0.0–3.0)
Eosinophils Absolute: 0.1 10*3/uL (ref 0.0–0.7)
Eosinophils Relative: 0.9 % (ref 0.0–5.0)
HCT: 49.6 % (ref 39.0–52.0)
Hemoglobin: 17.3 g/dL — ABNORMAL HIGH (ref 13.0–17.0)
Lymphocytes Relative: 41.3 % (ref 12.0–46.0)
Lymphs Abs: 2.7 10*3/uL (ref 0.7–4.0)
MCHC: 34.8 g/dL (ref 30.0–36.0)
MCV: 88.6 fl (ref 78.0–100.0)
Monocytes Absolute: 0.4 10*3/uL (ref 0.1–1.0)
Monocytes Relative: 6.6 % (ref 3.0–12.0)
Neutro Abs: 3.3 10*3/uL (ref 1.4–7.7)
Neutrophils Relative %: 50.9 % (ref 43.0–77.0)
Platelets: 198 10*3/uL (ref 150.0–400.0)
RBC: 5.6 Mil/uL (ref 4.22–5.81)
RDW: 12.8 % (ref 11.5–15.5)
WBC: 6.4 10*3/uL (ref 4.0–10.5)

## 2020-05-01 MED ORDER — AMPHETAMINE-DEXTROAMPHETAMINE 30 MG PO TABS: 30.0000 mg | ORAL_TABLET | Freq: Two times a day (BID) | ORAL | 0 refills | Status: DC

## 2020-05-01 NOTE — Patient Instructions (Addendum)
Go to the lab on the way out.   If you have mychart we'll likely use that to update you.    Update me as needed.  Take care.  Glad to see you. 

## 2020-05-01 NOTE — Progress Notes (Signed)
This visit occurred during the SARS-CoV-2 public health emergency.  Safety protocols were in place, including screening questions prior to the visit, additional usage of staff PPE, and extensive cleaning of exam room while observing appropriate contact time as indicated for disinfecting solutions.  ADD Compliant with meds: yes benefit from med (ie increase in concentration): yes, "fail to function w/o med." change in mood: no change in appetite: no Insomnia: no Tremor: no compliant with behavioral modification: yes Still working from home.  He has a set schedule.    He had his covid vaccine.    He has some shoulder pain, more on the right than the left.  Pain with abduction >90 deg, R>L.  Pain sleeping on R side.  He had taken some tylenol and has been stretching.  Shoulder pain going on for about 6 months intermittently.  Normal grip.    We talked getting routine labs done.  See orders.  See notes on labs.  He can occasionally have some pain that radiates down into the right fourth and fifth ray on the hand.  PMH and SH reviewed  ROS: Per HPI unless specifically indicated in ROS section   Meds, vitals, and allergies reviewed.   GEN: nad, alert and oriented, affect wnl and appropriate HEENT: ncat NECK: supple w/o LA CV: rrr.  PULM: ctab, no inc wob ABD: soft, +bs EXT: no edema Right shoulder exam without arm drop or decreased range of motion but he does have some pain on right shoulder abduction and external rotation.  He then had some popping at the right shoulder that is not at the [facility] joint when we tested him for [facility] loading.  After that happened, his pain with range of motion of the shoulder improved.

## 2020-05-03 ENCOUNTER — Other Ambulatory Visit: Payer: Self-pay | Admitting: Family Medicine

## 2020-05-03 DIAGNOSIS — M25519 Pain in unspecified shoulder: Secondary | ICD-10-CM

## 2020-05-03 DIAGNOSIS — Z13220 Encounter for screening for lipoid disorders: Secondary | ICD-10-CM | POA: Insufficient documentation

## 2020-05-03 NOTE — Assessment & Plan Note (Signed)
See notes on labs. 

## 2020-05-03 NOTE — Assessment & Plan Note (Signed)
He does not have pain on supraspinatus testing and his pain with external rotation improved after he had that popping sensation noted with AC loading.  AC joint is not tender.  He could have a labral pathology.  Discussed with patient.  Reasonable to check plain films and go from there.  He agrees. Distally neurovascular intact with normal grip.

## 2020-05-03 NOTE — Assessment & Plan Note (Signed)
Discussed behavioral modification.  Continue Adderall.  Update me as needed.  He agrees.  He will update me as needed.  No adverse effect on medication.

## 2020-05-12 ENCOUNTER — Telehealth (INDEPENDENT_AMBULATORY_CARE_PROVIDER_SITE_OTHER): Payer: BC Managed Care – PPO | Admitting: Family Medicine

## 2020-05-12 ENCOUNTER — Encounter: Payer: Self-pay | Admitting: Family Medicine

## 2020-05-12 ENCOUNTER — Other Ambulatory Visit: Payer: Self-pay

## 2020-05-12 ENCOUNTER — Telehealth: Payer: Self-pay | Admitting: Family Medicine

## 2020-05-12 VITALS — HR 80 | Wt 244.6 lb

## 2020-05-12 DIAGNOSIS — J069 Acute upper respiratory infection, unspecified: Secondary | ICD-10-CM | POA: Diagnosis not present

## 2020-05-12 MED ORDER — AZITHROMYCIN 250 MG PO TABS
ORAL_TABLET | ORAL | 0 refills | Status: DC
Start: 2020-05-12 — End: 2020-12-26

## 2020-05-12 NOTE — Telephone Encounter (Addendum)
Brandon Barr scheduled the 4pm virtual today.

## 2020-05-12 NOTE — Telephone Encounter (Signed)
Can we add him on at 4PM as a virtual?

## 2020-05-12 NOTE — Telephone Encounter (Signed)
Patient called today stating that he has a very sore throat. He stated that his child has a sinus infection that is being treated and he is concerned that this is a start of a sinus infection. Patient was advised at the time we did not have any virtual visit in the office and unfortunately are not able to bring him in with his symptom.  He stated he is going out of town tomorrow so he was looking for something today. Advised that he will need to be seen at a walkin or UC so they can evaluate and look into his throat  Patient voiced understanding  JUST FYI

## 2020-05-12 NOTE — Progress Notes (Signed)
Virtual visit completed through WebEx or similar program Patient location: home  Provider location:  at [facility], office  Participants: Patient and me (unless stated otherwise below)  Pandemic considerations d/w pt.   Limitations and rationale for visit method d/w patient.  Patient agreed to proceed.   CC: URI.    HPI:  Leaving town for the [employer]. He thought he had allergies initially but worse in the meantime.  ST, green rhinorrhea.  Temp ~99-100.  No vomiting, no diarrhea.  No wheeze.  No tooth pain.  Maxillary pain.  Some frontal pain.  No ear pain but ear ringing B.  ST from post nasal gtt.  No sputum from coughing.  No vomiting, no diarrhea.  Not SOB.    Son had been sick for the last week.    He had covid vaccine.  No known covid exposure.    Meds and allergies reviewed.   ROS: Per HPI unless specifically indicated in ROS section   NAD Speech wnl  A/P:  Presumed sinusitis.  Nontoxic.  Still okay for outpatient follow-up.  Discussed options. Start Zmax; rest, fluids, warm compresses, supportive care.  He agrees.  He will update me as needed.

## 2020-05-14 DIAGNOSIS — J069 Acute upper respiratory infection, unspecified: Secondary | ICD-10-CM | POA: Insufficient documentation

## 2020-05-14 NOTE — Assessment & Plan Note (Signed)
Presumed sinusitis.  Nontoxic.  Still okay for outpatient follow-up.  Discussed options. Start Zmax; rest, fluids, warm compresses, supportive care.  He agrees.  He will update me as needed.

## 2020-08-28 ENCOUNTER — Encounter: Payer: Self-pay | Admitting: Family Medicine

## 2020-08-28 ENCOUNTER — Other Ambulatory Visit: Payer: Self-pay | Admitting: *Deleted

## 2020-08-28 NOTE — Telephone Encounter (Signed)
MyChart refill request:  Adderall 30 mg tablet Last office visit:   05/01/2020 Last Filled:    60 tablet 0 05/01/2020

## 2020-08-29 MED ORDER — AMPHETAMINE-DEXTROAMPHETAMINE 30 MG PO TABS: 30.0000 mg | ORAL_TABLET | Freq: Two times a day (BID) | ORAL | 0 refills | Status: DC

## 2020-08-29 MED ORDER — AMPHETAMINE-DEXTROAMPHETAMINE 30 MG PO TABS
30.0000 mg | ORAL_TABLET | Freq: Two times a day (BID) | ORAL | 0 refills | Status: DC
Start: 2020-08-29 — End: 2020-12-13

## 2020-08-29 NOTE — Telephone Encounter (Signed)
Sent. Thanks.   

## 2020-12-11 ENCOUNTER — Encounter: Payer: Self-pay | Admitting: Family Medicine

## 2020-12-13 ENCOUNTER — Other Ambulatory Visit: Payer: Self-pay | Admitting: Family Medicine

## 2020-12-13 MED ORDER — AMPHETAMINE-DEXTROAMPHETAMINE 30 MG PO TABS
30.0000 mg | ORAL_TABLET | Freq: Two times a day (BID) | ORAL | 0 refills | Status: DC
Start: 2020-12-13 — End: 2020-12-30

## 2020-12-13 MED ORDER — AMPHETAMINE-DEXTROAMPHETAMINE 30 MG PO TABS
30.0000 mg | ORAL_TABLET | Freq: Two times a day (BID) | ORAL | 0 refills | Status: DC
Start: 2020-12-13 — End: 2021-04-29

## 2020-12-26 ENCOUNTER — Telehealth (INDEPENDENT_AMBULATORY_CARE_PROVIDER_SITE_OTHER): Payer: BC Managed Care – PPO | Admitting: Family Medicine

## 2020-12-26 ENCOUNTER — Encounter: Payer: Self-pay | Admitting: Family Medicine

## 2020-12-26 ENCOUNTER — Other Ambulatory Visit: Payer: Self-pay | Admitting: Family Medicine

## 2020-12-26 VITALS — BP 140/100 | HR 96 | Ht 73.0 in

## 2020-12-26 DIAGNOSIS — J069 Acute upper respiratory infection, unspecified: Secondary | ICD-10-CM

## 2020-12-26 DIAGNOSIS — J029 Acute pharyngitis, unspecified: Secondary | ICD-10-CM

## 2020-12-26 NOTE — Assessment & Plan Note (Signed)
Moderately sore, can swallow  Swollen/tender LN Was exp to mono in his household  Some post nasal drip and fever and intermittent headache   Testing for covid and strep and mono arranged for tomorrow inst to go to ER if unable to swallow or if sob  Rev symptomatic care with analgesics and gargles and chloraseptic

## 2020-12-26 NOTE — Assessment & Plan Note (Signed)
covid and strep and mono tests ordered  Was exp to mono  Fluids/rest and symptom care discussed ER parameters rev  Will continue to isolate until results

## 2020-12-26 NOTE — Patient Instructions (Signed)
Continue to rest and treat symptoms  Drink lots of fluids  Watch your temperature  If you struggle to breathe or symptoms become severe after hours please go to there ER  I ordered covid and strep and mono tests for you  The office will call to set that up for tomorrow

## 2020-12-26 NOTE — Progress Notes (Signed)
Virtual Visit via Video Note  I connected with Brandon Barr on 12/26/20 at  3:00 PM EST by a video enabled telemedicine application and verified that I am speaking with the correct person using two identifiers.  Location: Patient: home Provider: office   I discussed the limitations of evaluation and management by telemedicine and the availability of in person appointments. The patient expressed understanding and agreed to proceed.  Parties involved in encounter  Patient:Brandon Barr   Provider:  Roxy Manns MD    History of Present Illness: 45 yo pt of Dr Para March presents with c/o sore throat and fever (low grade)    started getting sick on Saturday  Started with headache and then ear pressure/discomfort Then sore throat - ? If nasal drip  Some hoarseness No nasal congestion   Some cough-not severe / clearing mucous from throat   Worse with lying down  Sniffling- runny nose  No facial pain or pressure  Temp of 100.0  No n/v/d    Daughter had mono last month  He thinks his LN are sore and also thyroid area   No known covid contacts  Not with public a lot but wife works in day care center  She tests negative several times     covid immunized pfizer in April  Had flu shot a month ago   Otc:  Day quil and ny quil  Ibuprofen or naproxen  Chloraseptic spray  Gargle with ST  netti pot   Patient Active Problem List   Diagnosis Date Noted  . Sore throat 12/26/2020  . Viral URI with cough 12/26/2020  . Upper respiratory tract infection 05/14/2020  . Lipid screening 05/03/2020  . Shoulder pain 05/03/2020  . Atypical chest pain 07/15/2017  . Hand laceration 06/18/2016  . Bronchospasm 08/26/2014  . ED (erectile dysfunction) 10/28/2012  . HEARING LOSS 12/26/2009  . KNEE PAIN, LEFT 12/11/2007  . GERD 05/12/2007  . Attention deficit disorder 04/09/2007   Past Medical History:  Diagnosis Date  . ADHD (attention deficit hyperactivity disorder)   .  GERD (gastroesophageal reflux disease)   . Meningitis, viral    2004   Past Surgical History:  Procedure Laterality Date  . MENISECTOMY     left knee and plica removed   Social History   Tobacco Use  . Smoking status: Former Games developer  . Smokeless tobacco: Former Neurosurgeon  . Tobacco comment: quit 2006  Substance Use Topics  . Alcohol use: Yes    Comment: very rarely  . Drug use: No   Family History  Problem Relation Age of Onset  . ADD / ADHD Mother   . Celiac disease Mother   . Heart attack Maternal Grandmother    No Known Allergies Current Outpatient Medications on File Prior to Visit  Medication Sig Dispense Refill  . albuterol (PROVENTIL HFA;VENTOLIN HFA) 108 (90 BASE) MCG/ACT inhaler Inhale 2 puffs into the lungs every 6 (six) hours as needed for shortness of breath. 1 Inhaler 2  . amphetamine-dextroamphetamine (ADDERALL) 30 MG tablet Take 1 tablet by mouth 2 (two) times daily. 60 tablet 0  . amphetamine-dextroamphetamine (ADDERALL) 30 MG tablet Take 1 tablet by mouth 2 (two) times daily. 60 tablet 0  . amphetamine-dextroamphetamine (ADDERALL) 30 MG tablet Take 1 tablet by mouth 2 (two) times daily. 60 tablet 0  . Multiple Vitamin (MULTIVITAMIN) tablet Take 1 tablet by mouth daily.     No current facility-administered medications on file prior to visit.     Observations/Objective:  Patient appears well, in no distress Weight is baseline  No facial swelling or asymmetry Mildly hoarse voice Pt notes discomfort when palpating neck and area under mandible (lymph nodes)  Mouth does not appear dry  No obvious tremor or mobility impairment Moving neck and UEs normally Able to hear the call well  No cough or shortness of breath during interview  Talkative and mentally sharp with no cognitive changes No skin changes on face or neck , no rash or pallor Affect is normal    Assessment and Plan: Problem List Items Addressed This Visit      Respiratory   Viral URI with cough     covid and strep and mono tests ordered  Was exp to mono  Fluids/rest and symptom care discussed ER parameters rev  Will continue to isolate until results          Other   Sore throat - Primary    Moderately sore, can swallow  Swollen/tender LN Was exp to mono in his household  Some post nasal drip and fever and intermittent headache   Testing for covid and strep and mono arranged for tomorrow inst to go to ER if unable to swallow or if sob  Rev symptomatic care with analgesics and gargles and chloraseptic           Follow Up Instructions: Continue to rest and treat symptoms  Drink lots of fluids  Watch your temperature  If you struggle to breathe or symptoms become severe after hours please go to there ER  I ordered covid and strep and mono tests for you  The office will call to set that up for tomorrow    I discussed the assessment and treatment plan with the patient. The patient was provided an opportunity to ask questions and all were answered. The patient agreed with the plan and demonstrated an understanding of the instructions.   The patient was advised to call back or seek an in-person evaluation if the symptoms worsen or if the condition fails to improve as anticipated.     Roxy Manns, MD

## 2020-12-27 ENCOUNTER — Other Ambulatory Visit (INDEPENDENT_AMBULATORY_CARE_PROVIDER_SITE_OTHER): Payer: BC Managed Care – PPO

## 2020-12-27 DIAGNOSIS — J029 Acute pharyngitis, unspecified: Secondary | ICD-10-CM | POA: Diagnosis not present

## 2020-12-27 DIAGNOSIS — J069 Acute upper respiratory infection, unspecified: Secondary | ICD-10-CM

## 2020-12-27 LAB — POCT RAPID STREP A (OFFICE): Rapid Strep A Screen: NEGATIVE

## 2020-12-28 LAB — MONONUCLEOSIS SCREEN: Mono Screen: NEGATIVE

## 2020-12-29 ENCOUNTER — Encounter: Payer: Self-pay | Admitting: Family Medicine

## 2020-12-29 LAB — NOVEL CORONAVIRUS, NAA: SARS-CoV-2, NAA: DETECTED — AB

## 2020-12-29 LAB — SARS-COV-2, NAA 2 DAY TAT

## 2020-12-29 LAB — SPECIMEN STATUS REPORT

## 2020-12-29 MED ORDER — FLUTICASONE PROPIONATE 50 MCG/ACT NA SUSP: 2.0000 | Freq: Every day | NASAL | 1 refills | Status: DC

## 2020-12-29 NOTE — Telephone Encounter (Signed)
[facility] Day - Client TELEPHONE ADVICE RECORD [facility] Patient Name: Brandon Barr Gender: Male DOB: 03/12/1976 Age: 45 Y 4 M 10 D Return Phone Number: [phone] (Primary) Address: City/State/Zip: [place] Client [facility] Day - Client Client Site [facility] - Day Physician Raechel Ache - MD Contact Type Call Who Is Calling Patient / Member / Family / Caregiver Call Type Triage / Clinical Relationship To Patient Self Return Phone Number [phone] (Primary) Chief Complaint Chest Pain (non urgent symptoms) Reason for Call Symptomatic / Request for Health Information Initial Comment Denishia from Dr. Lianne Bushy office had patient on line. Patient tested positive for COVID. Has virtual visit tomorrow and needs triage. He is having chest pain and ear pain. Lost taste and smell. Strep and Mono tests were negative Translation No Nurse Assessment Nurse: Micheline Maze, RN, Clydie Braun Date/Time Lamount Cohen Time): 12/29/2020 2:32:30 PM Confirm and document reason for call. If symptomatic, describe symptoms. ---Caller states that he had a horrible sore throat - has some "chest issues" some pressure - the sore throat started Sunday and has gotten better but continues to have chest pressure more to the left - mostly under the ribs feels tight - loss taste and smell - still having fever 102 last night Does the patient have any new or worsening symptoms? ---Yes Will a triage be completed? ---Yes Related visit to physician within the last 2 weeks? ---Yes Does the PT have any chronic conditions? (i.e. diabetes, asthma, this includes High risk factors for pregnancy, etc.) ---Yes List chronic conditions. ---Asthma, ADHD(adderal) Is this a behavioral health or substance abuse call? ---No Guidelines Guideline Title Affirmed Question Affirmed Notes Nurse Date/Time (Eastern Time) Chest Pain [1] Chest pain (or "angina") comes and  goes AND [2] is happening more often (increasing in frequency) or getting worse (increasing in severity) (Exception: Micheline Maze, RN, Clydie Braun 12/29/2020 2:37:23 PM PLEASE NOTE: All timestamps contained within this report are represented as [place] Standard Time. CONFIDENTIALTY NOTICE: This fax transmission is intended only for the addressee. It contains information that is legally privileged, confidential or otherwise protected from use or disclosure. If you are not the intended recipient, you are strictly prohibited from reviewing, disclosing, copying using or disseminating any of this information or taking any action in reliance on or regarding this information. If you have received this fax in error, please notify us immediately by telephone so that we can arrange for its return to [place]. Phone: [phone], Toll-Free: [phone], Fax: [phone] Page: 2 of 2 Call Id: [id number] Guidelines Guideline Title Affirmed Question Affirmed Notes Nurse Date/Time Lamount Cohen Time) chest pains that last only a few seconds) Disp. Time Lamount Cohen Time) Disposition Final User 12/29/2020 2:44:50 PM Go to ED Now Yes Micheline Maze, RN, Pablo Ledger Disagree/Comply Comply Caller Understands Yes PreDisposition Call Doctor Care Advice Given Per Guideline GO TO ED NOW: * You need to be seen in the Emergency Department. * Go to the ED at ___________ Hospital. * Leave now. Drive carefully. ANOTHER ADULT SHOULD DRIVE: * It is better and safer if another adult drives instead of you. CARE ADVICE given per Chest Pain (Adult) guideline. Comments User: Lanier Clam, RN Date/Time Lamount Cohen Time): 12/29/2020 2:46:08 PM Patient not certain whether [place] to the ER or UC advised needs to be seen today Referrals GO TO FACILITY UNDECIDED

## 2020-12-29 NOTE — Telephone Encounter (Signed)
FYI to pcp

## 2020-12-30 ENCOUNTER — Telehealth (INDEPENDENT_AMBULATORY_CARE_PROVIDER_SITE_OTHER): Payer: BC Managed Care – PPO | Admitting: Nurse Practitioner

## 2020-12-30 ENCOUNTER — Encounter: Payer: Self-pay | Admitting: Nurse Practitioner

## 2020-12-30 VITALS — HR 70 | Temp 97.8°F | Ht 74.0 in | Wt 240.0 lb

## 2020-12-30 DIAGNOSIS — J0140 Acute pansinusitis, unspecified: Secondary | ICD-10-CM | POA: Diagnosis not present

## 2020-12-30 DIAGNOSIS — J209 Acute bronchitis, unspecified: Secondary | ICD-10-CM | POA: Diagnosis not present

## 2020-12-30 MED ORDER — AFRIN NASAL SPRAY 0.05 % NA SOLN: 1.0000 | Freq: Two times a day (BID) | NASAL | 0 refills | Status: DC

## 2020-12-30 MED ORDER — AMOXICILLIN-POT CLAVULANATE 875-125 MG PO TABS: 1.0000 | ORAL_TABLET | Freq: Two times a day (BID) | ORAL | 0 refills | Status: DC

## 2020-12-30 MED ORDER — BENZONATATE 100 MG PO CAPS: 100.0000 mg | ORAL_CAPSULE | Freq: Three times a day (TID) | ORAL | 0 refills | Status: DC | PRN

## 2020-12-30 MED ORDER — ALBUTEROL SULFATE HFA 108 (90 BASE) MCG/ACT IN AERS: 1.0000 | INHALATION_SPRAY | Freq: Four times a day (QID) | RESPIRATORY_TRACT | 0 refills | Status: DC | PRN

## 2020-12-30 NOTE — Progress Notes (Signed)
Virtual Visit via Video Note  I connected with@ on 12/30/20 at 11:00 AM EST by a video enabled telemedicine application and verified that I am speaking with the correct person using two identifiers.  Location: Patient:Home Provider: Office Participants: patient and provider  I discussed the limitations of evaluation and management by telemedicine and the availability of in person appointments. I also discussed with the patient that there may be a patient responsible charge related to this service. The patient expressed understanding and agreed to proceed.  CC:Pt c/o left ear pain x5 days and some slight chest discomfort that has eased up after his cough went away. Positive COVID test on 12/28/20. Pt states ear pain is constant with random sharp pains.   History of Present Illness: Symptom onset 12/24/2020 Otalgia  There is pain in the left ear. This is a new problem. The current episode started in the past 7 days. The problem occurs constantly. The problem has been unchanged. The maximum temperature recorded prior to his arrival was 100.4 - 100.9 F. The fever has been present for 3 to 4 days. The pain is severe. Associated symptoms include coughing, headaches and rhinorrhea. Pertinent negatives include no abdominal pain, diarrhea, ear discharge, hearing loss, neck pain, rash, sore throat or vomiting. Associated symptoms comments: Intermittent dizziness. He has tried acetaminophen and NSAIDs (decongestant) for the symptoms. The treatment provided no relief. There is no history of a chronic ear infection, hearing loss or a tympanostomy tube.  Cough This is a new problem. The current episode started in the past 7 days. The problem has been unchanged. The problem occurs constantly. The cough is productive of purulent sputum. Associated symptoms include chest pain, chills, ear congestion, ear pain, a fever, headaches, myalgias, nasal congestion, postnasal drip and rhinorrhea. Pertinent negatives include  no heartburn, hemoptysis, rash, sore throat, shortness of breath, sweats, weight loss or wheezing. The symptoms are aggravated by lying down. He has tried OTC cough suppressant for the symptoms. The treatment provided mild relief. There is no history of asthma, bronchiectasis, bronchitis, COPD, emphysema, environmental allergies or pneumonia.   Observations/Objective: Physical Exam Constitutional:      General: He is not in acute distress.    Appearance: He is ill-appearing.  HENT:     Right Ear: External ear normal.     Left Ear: External ear normal.  Pulmonary:     Effort: Pulmonary effort is normal.  Neurological:     Mental Status: He is alert and oriented to person, place, and time.    Assessment and Plan: Staley was seen today for acute visit.  Diagnoses and all orders for this visit:  Acute non-recurrent pansinusitis -     benzonatate (TESSALON) 100 MG capsule; Take 1-2 capsules (100-200 mg total) by mouth 3 (three) times daily as needed. -     albuterol (VENTOLIN HFA) 108 (90 Base) MCG/ACT inhaler; Inhale 1-2 puffs into the lungs every 6 (six) hours as needed for shortness of breath. -     oxymetazoline (AFRIN NASAL SPRAY) 0.05 % nasal spray; Place 1 spray into both nostrils 2 (two) times daily. Use only for 3days, then stop -     amoxicillin-clavulanate (AUGMENTIN) 875-125 MG tablet; Take 1 tablet by mouth 2 (two) times daily.  Acute bronchitis, unspecified organism -     benzonatate (TESSALON) 100 MG capsule; Take 1-2 capsules (100-200 mg total) by mouth 3 (three) times daily as needed. -     albuterol (VENTOLIN HFA) 108 (90 Base) MCG/ACT inhaler; Inhale  1-2 puffs into the lungs every 6 (six) hours as needed for shortness of breath. -     oxymetazoline (AFRIN NASAL SPRAY) 0.05 % nasal spray; Place 1 spray into both nostrils 2 (two) times daily. Use only for 3days, then stop -     amoxicillin-clavulanate (AUGMENTIN) 875-125 MG tablet; Take 1 tablet by mouth 2 (two) times  daily.   Follow Up Instructions: See above Flonase and Afrin use: apply 1spray of afrin in each nare, wait , then apply 2sprays of flonase in each nare. Use both nasal spray consecutively x 3days, then flonase only for at least 14days. Encourage adequate oral hydration.  I discussed the assessment and treatment plan with the patient. The patient was provided an opportunity to ask questions and all were answered. The patient agreed with the plan and demonstrated an understanding of the instructions.   The patient was advised to call back or seek an in-person evaluation if the symptoms worsen or if the condition fails to improve as anticipated.   Alysia Penna, NP

## 2020-12-30 NOTE — Patient Instructions (Signed)
Flonase and Afrin use: apply 1spray of afrin in each nare, wait , then apply 2sprays of flonase in each nare. Use both nasal spray consecutively x 3days, then flonase only for at least 14days.  Encourage adequate oral hydration.  COVID-19: What to Do if You Are Sick If you have a fever, cough or other symptoms, you might have COVID-19. Most people have mild illness and are able to recover at home. If you are sick:  Keep track of your symptoms.  If you have an emergency warning sign (including trouble breathing), call 911. Steps to help prevent the spread of COVID-19 if you are sick If you are sick with COVID-19 or think you might have COVID-19, follow the steps below to care for yourself and to help protect other people in your home and community. Stay home except to get medical care  Stay home. Most people with COVID-19 have mild illness and can recover at home without medical care. Do not leave your home, except to get medical care. Do not visit public areas.  Take care of yourself. Get rest and stay hydrated. Take over-the-counter medicines, such as acetaminophen, to help you feel better.  Stay in touch with your doctor. Call before you get medical care. Be sure to get care if you have trouble breathing, or have any other emergency warning signs, or if you think it is an emergency.  Avoid public transportation, ride-sharing, or taxis. Separate yourself from other people As much as possible, stay in a specific room and away from other people and pets in your home. If possible, you should use a separate bathroom. If you need to be around other people or animals in or outside of the home, wear a mask. Tell your close contactsthat they may have been exposed to COVID-19. An infected person can spread COVID-19 starting 48 hours (or 2 days) before the person has any symptoms or tests positive. By letting your close contacts know they may have been exposed to COVID-19, you are helping to  protect everyone.  Additional guidance is available for those living in close quarters and shared housing.  See COVID-19 and Animals if you have questions about pets.  If you are diagnosed with COVID-19, someone from the health department may call you. Answer the call to slow the spread. Monitor your symptoms  Symptoms of COVID-19 include fever, cough, or other symptoms.  Follow care instructions from your healthcare provider and local health department. Your local health authorities may give instructions on checking your symptoms and reporting information. When to seek emergency medical attention Look for emergency warning signs* for COVID-19. If someone is showing any of these signs, seek emergency medical care immediately:  Trouble breathing  Persistent pain or pressure in the chest  New confusion  Inability to wake or stay awake  Pale, gray, or blue-colored skin, lips, or nail beds, depending on skin tone *This list is not all possible symptoms. Please call your medical provider for any other symptoms that are severe or concerning to you. Call 911 or call ahead to your local emergency facility: Notify the operator that you are seeking care for someone who has or may have COVID-19. Call ahead before visiting your doctor  Call ahead. Many medical visits for routine care are being postponed or done by phone or telemedicine.  If you have a medical appointment that cannot be postponed, call your doctor's office, and tell them you have or may have COVID-19. This will help the office protect themselves  and other patients. Get  tested  If you have symptoms of COVID-19, get tested. While waiting for test results, you stay away from others, including staying apart from those living in your household.  You can visit your state, tribal, local, and territorialhealth department's website to look for the latest local information on testing sites. If you are sick, wear a mask over your nose  and mouth  You should wear a mask over your nose and mouth if you must be around other people or animals, including pets (even at home).  You don't need to wear the mask if you are alone. If you can't put on a mask (because of trouble breathing, for example), cover your coughs and sneezes in some other way. Try to stay at least 6 feet away from other people. This will help protect the people around you.  Masks should not be placed on young children under age 50 years, anyone who has trouble breathing, or anyone who is not able to remove the mask without help. Note: During the COVID-19 pandemic, medical grade facemasks are reserved for healthcare workers and some first responders. Cover your coughs and sneezes  Cover your mouth and nose with a tissue when you cough or sneeze.  Throw away used tissues in a lined trash can.  Immediately wash your hands with soap and water for at least 20 seconds. If soap and water are not available, clean your hands with an alcohol-based hand sanitizer that contains at least 60% alcohol. Clean your hands often  Wash your hands often with soap and water for at least 20 seconds. This is especially important after blowing your nose, coughing, or sneezing; going to the bathroom; and before eating or preparing food.  Use hand sanitizer if soap and water are not available. Use an alcohol-based hand sanitizer with at least 60% alcohol, covering all surfaces of your hands and rubbing them together until they feel dry.  Soap and water are the best option, especially if hands are visibly dirty.  Avoid touching your eyes, nose, and mouth with unwashed hands.  Handwashing Tips Avoid sharing personal household items  Do not share dishes, drinking glasses, cups, eating utensils, towels, or bedding with other people in your home.  Wash these items thoroughly after using them with soap and water or put in the dishwasher. Clean all "high-touch" surfaces everyday  Clean  and disinfect high-touch surfaces in your "sick room" and bathroom; wear disposable gloves. Let someone else clean and disinfect surfaces in common areas, but you should clean your bedroom and bathroom, if possible.  If a caregiver or other person needs to clean and disinfect a sick person's bedroom or bathroom, they should do so on an as-needed basis. The caregiver/other person should wear a mask and disposable gloves prior to cleaning. They should wait as long as possible after the person who is sick has used the bathroom before coming in to clean and use the bathroom. ? High-touch surfaces include phones, remote controls, counters, tabletops, doorknobs, bathroom fixtures, toilets, keyboards, tablets, and bedside tables.  Clean and disinfect areas that may have blood, stool, or body fluids on them.  Use household cleaners and disinfectants. Clean the area or item with soap and water or another detergent if it is dirty. Then, use a household disinfectant. ? Be sure to follow the instructions on the label to ensure safe and effective use of the product. Many products recommend keeping the surface wet for several minutes to ensure germs are  killed. Many also recommend precautions such as wearing gloves and making sure you have good ventilation during use of the product. ? Use a product from [employer] List N: Disinfectants for Coronavirus (COVID-19). ? Complete Disinfection Guidance When you can be around others after being sick with COVID-19 Deciding when you can be around others is different for different situations. Find out when you can safely end home isolation. For any additional questions about your care, contact your healthcare provider or state or local health department. 02/23/2020 Content source: [facility] ([facility]), Division of Viral Diseases This information is not intended to replace advice given to you by your health care provider. Make sure you  discuss any questions you have with your health care provider. Document Revised: 10/09/2020 Document Reviewed: 10/09/2020 Elsevier Patient Education  2021 [employer].

## 2021-04-27 ENCOUNTER — Encounter: Payer: Self-pay | Admitting: Family Medicine

## 2021-04-29 ENCOUNTER — Other Ambulatory Visit: Payer: Self-pay | Admitting: Family Medicine

## 2021-04-29 MED ORDER — AMPHETAMINE-DEXTROAMPHETAMINE 30 MG PO TABS: 30.0000 mg | ORAL_TABLET | Freq: Two times a day (BID) | ORAL | 0 refills | Status: DC

## 2021-05-04 ENCOUNTER — Telehealth: Payer: Self-pay

## 2021-05-04 NOTE — Telephone Encounter (Signed)
PA was done for Adderall 30 mg tablets. PA case # [id number]. PA approved from 04/04/21 to 04/04/22. Patient is aware.

## 2021-07-27 ENCOUNTER — Encounter: Payer: Self-pay | Admitting: Family Medicine

## 2021-07-27 ENCOUNTER — Ambulatory Visit: Payer: BC Managed Care – PPO | Admitting: Family Medicine

## 2021-07-27 ENCOUNTER — Other Ambulatory Visit: Payer: Self-pay

## 2021-07-27 VITALS — BP 122/80 | HR 96 | Temp 97.9°F | Wt 246.0 lb

## 2021-07-27 DIAGNOSIS — K1370 Unspecified lesions of oral mucosa: Secondary | ICD-10-CM

## 2021-07-27 DIAGNOSIS — R252 Cramp and spasm: Secondary | ICD-10-CM

## 2021-07-27 DIAGNOSIS — L818 Other specified disorders of pigmentation: Secondary | ICD-10-CM

## 2021-07-27 DIAGNOSIS — R195 Other fecal abnormalities: Secondary | ICD-10-CM | POA: Diagnosis not present

## 2021-07-27 NOTE — Progress Notes (Signed)
This visit occurred during the SARS-CoV-2 public health emergency.  Safety protocols were in place, including screening questions prior to the visit, additional usage of staff PPE, and extensive cleaning of exam room while observing appropriate contact time as indicated for disinfecting solutions.  Lump in the posterior portion of the mouth, on the L side of midline.  Present about 1 month.  Not painful.  No bleeding.  See exam.  Leg skin changes.  Medial portion of B shins with hyperpigmentation.  Some leg cramping while asleep.  No cramping with walking.  No ankle swelling.    GI sx.  Noted after eating.  Loose stools after meals, frequent issue, usually after meals but can happen o/w.  No blood in stool.  No black stools.  No weight loss.  Other family members with similar sx.  Mother has a GI stromal tumor, in her stomach.  He had covid in 12/2020.  GI sx were better after that.  No abd pain, no diarrhea.  Then sx returned.  No one else at home with similar.  No greasy stools.    Meds, vitals, and allergies reviewed.   ROS: Per HPI unless specifically indicated in ROS section   GEN: nad, alert and oriented HEENT: mucous membranes moist, I was able to briefly see the whitish change on the L posterior pharynx.  I couldn't see it with using tongue depressor.  He had to move his tongue with his finger and he had to manipulate the tissue locally.  I didn't see an ulcer.  No oral lesions otherwise. NECK: supple w/o LA CV: rrr. PULM: ctab, no inc wob ABD: soft, +bs EXT: no edema SKIN: no acute rash Hemosiderin stain B medial shins.  Normal DP pulses B.

## 2021-07-27 NOTE — Patient Instructions (Signed)
Go to the lab on the way out.   If you have mychart we'll likely use that to update you.    We'll call about seeing ENT.  Cut out gluten for a week and see if you feel better.  Either way, let me know.  If not better, then we may need to have you see the GI clinic.   Take care.  Glad to see you.

## 2021-07-28 LAB — CBC WITH DIFFERENTIAL/PLATELET
Absolute Monocytes: 504 cells/uL (ref 200–950)
Basophils Absolute: 21 cells/uL (ref 0–200)
Basophils Relative: 0.3 %
Eosinophils Absolute: 97 cells/uL (ref 15–500)
Eosinophils Relative: 1.4 %
HCT: 48.9 % (ref 38.5–50.0)
Hemoglobin: 17.3 g/dL — ABNORMAL HIGH (ref 13.2–17.1)
Lymphs Abs: 2684 cells/uL (ref 850–3900)
MCH: 30.4 pg (ref 27.0–33.0)
MCHC: 35.4 g/dL (ref 32.0–36.0)
MCV: 85.9 fL (ref 80.0–100.0)
MPV: 10.1 fL (ref 7.5–12.5)
Monocytes Relative: 7.3 %
Neutro Abs: 3595 cells/uL (ref 1500–7800)
Neutrophils Relative %: 52.1 %
Platelets: 230 10*3/uL (ref 140–400)
RBC: 5.69 10*6/uL (ref 4.20–5.80)
RDW: 12.4 % (ref 11.0–15.0)
Total Lymphocyte: 38.9 %
WBC: 6.9 10*3/uL (ref 3.8–10.8)

## 2021-07-28 LAB — COMPREHENSIVE METABOLIC PANEL
AG Ratio: 2.4 (calc) (ref 1.0–2.5)
ALT: 48 U/L — ABNORMAL HIGH (ref 9–46)
AST: 24 U/L (ref 10–40)
Albumin: 4.8 g/dL (ref 3.6–5.1)
Alkaline phosphatase (APISO): 72 U/L (ref 36–130)
BUN: 12 mg/dL (ref 7–25)
CO2: 31 mmol/L (ref 20–32)
Calcium: 9.9 mg/dL (ref 8.6–10.3)
Chloride: 103 mmol/L (ref 98–110)
Creat: 1.28 mg/dL (ref 0.60–1.29)
Globulin: 2 g/dL (calc) (ref 1.9–3.7)
Glucose, Bld: 79 mg/dL (ref 65–99)
Potassium: 4.8 mmol/L (ref 3.5–5.3)
Sodium: 140 mmol/L (ref 135–146)
Total Bilirubin: 1.1 mg/dL (ref 0.2–1.2)
Total Protein: 6.8 g/dL (ref 6.1–8.1)

## 2021-07-28 LAB — CK: Total CK: 56 U/L (ref 44–196)

## 2021-07-28 LAB — TSH: TSH: 2.56 mIU/L (ref 0.40–4.50)

## 2021-07-29 DIAGNOSIS — R195 Other fecal abnormalities: Secondary | ICD-10-CM | POA: Insufficient documentation

## 2021-07-29 DIAGNOSIS — L818 Other specified disorders of pigmentation: Secondary | ICD-10-CM | POA: Insufficient documentation

## 2021-07-29 DIAGNOSIS — R252 Cramp and spasm: Secondary | ICD-10-CM | POA: Insufficient documentation

## 2021-07-29 DIAGNOSIS — K1370 Unspecified lesions of oral mucosa: Secondary | ICD-10-CM | POA: Insufficient documentation

## 2021-07-29 NOTE — Assessment & Plan Note (Signed)
Family history of similar.  Could be IBS.  Discussed with patient about options. See notes on labs. I asked him to cut out gluten for a week and see if he feels better.  Either way, I want him to let me know.  If not better, then we may need to have him see the GI clinic.

## 2021-07-29 NOTE — Assessment & Plan Note (Signed)
See notes on labs.  He can use compression stockings and update me as needed.

## 2021-07-29 NOTE — Assessment & Plan Note (Signed)
Refer to ENT.  Discussed with patient.  He agrees.

## 2021-07-29 NOTE — Assessment & Plan Note (Signed)
He also tends to sweat heavily and this could be related to relative dehydration.  See notes on labs.  Discussed maintaining adequate hydration.

## 2021-08-30 ENCOUNTER — Other Ambulatory Visit: Payer: Self-pay | Admitting: Family Medicine

## 2021-08-30 ENCOUNTER — Encounter: Payer: Self-pay | Admitting: Family Medicine

## 2021-08-31 ENCOUNTER — Other Ambulatory Visit: Payer: Self-pay | Admitting: Family Medicine

## 2021-08-31 MED ORDER — AMPHETAMINE-DEXTROAMPHETAMINE 30 MG PO TABS: 30.0000 mg | ORAL_TABLET | Freq: Two times a day (BID) | ORAL | 0 refills | Status: DC

## 2021-08-31 NOTE — Telephone Encounter (Signed)
Sent. Thanks.   

## 2021-08-31 NOTE — Telephone Encounter (Signed)
Refill request for Adderall 30 mg tablets  LOV - 07/27/21 Next OV - not scheduled Last refill - 04/29/21 x 3 #60/0

## 2021-09-04 ENCOUNTER — Encounter: Payer: Self-pay | Admitting: Otolaryngology

## 2021-09-10 NOTE — Discharge Instructions (Signed)
T & A INSTRUCTION SHEET - [facility]  CREIGHTON VAUGHT, MD  [address] [place], [place] [address] [place].  [phone]  INFORMATION SHEET FOR A TONSILLECTOMY AND ADENDOIDECTOMY  About Your Tonsils and Adenoids  The tonsils and adenoids are normal body tissues that are part of our immune system.  They normally help to protect us against diseases that may enter our mouth and nose. However, sometimes the tonsils and/or adenoids become too large and obstruct our breathing, especially at night.    If either of these things happen it helps to remove the tonsils and adenoids in order to become healthier. The operation to remove the tonsils and adenoids is called a tonsillectomy and adenoidectomy.  The Location of Your Tonsils and Adenoids  The tonsils are located in the back of the throat on both side and sit in a cradle of muscles. The adenoids are located in the roof of the mouth, behind the nose, and closely associated with the opening of the Eustachian tube to the ear.  Surgery on Tonsils and Adenoids  A tonsillectomy and adenoidectomy is a short operation which takes about thirty minutes.  This includes being put to sleep and being awakened. Tonsillectomies and adenoidectomies are performed at [facility] and may require observation period in the recovery room prior to going home. Children are required to remain in recovery for at least 45 minutes.   Following the Operation for a Tonsillectomy  A cautery machine is used to control bleeding. Bleeding from a tonsillectomy and adenoidectomy is minimal and postoperatively the risk of bleeding is approximately four percent, although this rarely life threatening.  After your tonsillectomy and adenoidectomy post-op care at home: 1. Our patients are able to go home the same day. You may be given prescriptions for pain medications, if indicated. 2. It is extremely important to  remember that fluid intake is of utmost importance after a tonsillectomy. The amount that you drink must be maintained in the postoperative period. A good indication of whether a child is getting enough fluid is whether his/her urine output is constant. As long as children are urinating or wetting their diaper every 6 - 8 hours this is usually enough fluid intake.   3. Although rare, this is a risk of some bleeding in the first ten days after surgery. This usually occurs between day five and nine postoperatively. This risk of bleeding is approximately four percent. If you or your child should have any bleeding you should remain calm and notify our office or go directly to the emergency room at [facility] where they will contact us. Our doctors are available seven days a week for notification. We recommend sitting up quietly in a chair, place an ice pack on the front of the neck and spitting out the blood gently until we are able to contact you. Adults should gargle gently with ice water and this may help stop the bleeding. If the bleeding does not stop after a short time, i.e. 10 to 15 minutes, or seems to be increasing again, please contact us or go to the hospital.   4. It is common for the pain to be worse at 5 - 7 days postoperatively. This occurs because the "scab" is peeling off and the mucous membrane (skin of the throat) is growing back where the tonsils were.   5. It is common for a low-grade fever, less than 102, during the first week   after a tonsillectomy and adenoidectomy. It is usually due to not drinking enough liquids, and we suggest your use liquid Tylenol (acetaminophen) or the pain medicine with Tylenol (acetaminophen) prescribed in order to keep your temperature below 102. Please follow the directions on the back of the bottle. 6. Recommendations for post-operative pain in children and adults: a) For Children 12 and younger: Recommendations are for oral Tylenol  (acetaminophen) and oral Motrin (Ibuprofen) along with a prescription dose of Prednisolone which is a steroid to help with pain and swelling. Administer the Tylenol (acetaminophen) and Motrin as stated on bottle for patient's age/weight. Sometimes it may be necessary to alternate the Tylenol (acetaminophen) and Motrin for improved pain control. Motrin does last slightly longer so many patients benefit from being given this prior to bedtime. All children should avoid Aspirin products for 2 weeks following surgery. b) For children over the age of 12: Tylenol (acetaminophen) is the preferred first choice for pain control. Depending on your child's size, sometimes they will be given a combination of Tylenol (acetaminophen) and hydrocodone medication or sometimes it will be recommended they take Motrin (ibuprofen) in addition to the Tylenol (acetaminophen). Narcotics should always be used with caution in children following surgery as they can suppress their breathing and switching to over the counter Tylenol (acetaminophen) and Motrin (ibuprofen) as soon as possible is recommended. All patients should avoid Aspirin products for 2 weeks following surgery. c) Adults: Usually adults will require a narcotic pain medication following a tonsillectomy. This usually has either hydrocodone or oxycodone in it and can usually be taken every 4 to 6 hours as needed for moderate pain. If the medication does not have Tylenol (acetaminophen) in it, you may also supplement Tylenol (acetaminophen) as needed every 4 to 6 hours for breakthrough or mild pain. Adults are also given Viscous Lidocaine to swish and spit every 6 hours to help with topical pain. Adults should avoid Aspirin, Aleve, Motrin, and Ibuprofen products for 2 weeks following surgery as they can increase your risk of bleeding. 7. If you happen to look in the mirror or into your child's mouth you will see white/gray patches on the back of the throat. This is what a scab  looks like in the mouth and is normal after having a tonsillectomy and adenoidectomy. They will disappear once the tonsil areas heal completely. However, it may cause a noticeable odor, and this too will disappear with time.     8. You or your child may experience ear pain after having a tonsillectomy and adenoidectomy.  This is called referred pain and comes from the throat, but it is felt in the ears.  Ear pain is quite common and expected. It will usually go away after ten days. There is usually nothing wrong with the ears, and it is primarily due to the healing area stimulating the nerve to the ear that runs along the side of the throat. Use either the prescribed pain medicine or Tylenol (acetaminophen) as needed.  9. The throat tissues after a tonsillectomy are obviously sensitive. Smoking around children who have had a tonsillectomy significantly increases the risk of bleeding. DO NOT SMOKE!  What to Expect Each Day  First Day at Home 1. Patients will be discharged home the same day.  2. Drink at least four glasses of liquid a day. Clear, cool liquids are recommended. Fruit juices containing citric acid are not recommended because they tend to cause pain. Carbonated beverages are allowed if you pour them from glass   to glass to remove the bubbles as these tend to cause discomfort. Avoid alcoholic beverages.  3. Eat very soft foods such as soups, broth, jello, custard, pudding, ice cream, popsicles, applesauce, mashed potatoes, and in general anything that you can crush between your tongue and the roof of your mouth. Try adding [employer] Mix into your food for extra calories. It is not uncommon to lose 5 to 10 pounds of fluid weight. The weight will be gained back quickly once you're feeling better and drinking more.  4. Sleep with your head elevated on two pillows for about three days to help decrease the swelling.  5. DO NOT SMOKE!  Day Two  1. Rest as much as possible. Use common  sense in your activities.  2. Continue drinking at least four glasses of liquid per day.  3. Follow the soft diet.  4. Use your pain medication as needed.  Day Three  1. Advance your activity as you are able and continue to follow the previous day's suggestions.  Days Four Through Six  1. Advance your diet and begin to eat more solid foods such as chopped hamburger. 2. Advance your activities slowly. Children should be kept mostly around the house.  3. Not uncommonly, there will be more pain at this time. It is temporary, usually lasting a day or two.  Day Seven Through Ten  1. Most individuals by this time are able to return to work or school unless otherwise instructed. Consider sending children back to school for a half day on the first day back.

## 2021-09-12 ENCOUNTER — Ambulatory Visit: Payer: BC Managed Care – PPO | Admitting: Anesthesiology

## 2021-09-12 ENCOUNTER — Other Ambulatory Visit: Payer: Self-pay

## 2021-09-12 ENCOUNTER — Encounter
Admission: RE | Disposition: A | Discharge status: Home / Self Care | Payer: Self-pay | Source: Home / Self Care | Attending: Otolaryngology

## 2021-09-12 ENCOUNTER — Ambulatory Visit
Admission: RE | Admit: 2021-09-12 | Discharge: 2021-09-12 | Disposition: A | Discharge status: Home / Self Care | Payer: BC Managed Care – PPO | Attending: Otolaryngology | Admitting: Otolaryngology

## 2021-09-12 ENCOUNTER — Encounter: Payer: Self-pay | Admitting: Otolaryngology

## 2021-09-12 DIAGNOSIS — F909 Attention-deficit hyperactivity disorder, unspecified type: Secondary | ICD-10-CM | POA: Diagnosis not present

## 2021-09-12 DIAGNOSIS — J351 Hypertrophy of tonsils: Secondary | ICD-10-CM | POA: Diagnosis present

## 2021-09-12 DIAGNOSIS — J45909 Unspecified asthma, uncomplicated: Secondary | ICD-10-CM | POA: Diagnosis not present

## 2021-09-12 DIAGNOSIS — Z87891 Personal history of nicotine dependence: Secondary | ICD-10-CM | POA: Insufficient documentation

## 2021-09-12 DIAGNOSIS — Z6831 Body mass index (BMI) 31.0-31.9, adult: Secondary | ICD-10-CM | POA: Insufficient documentation

## 2021-09-12 DIAGNOSIS — K219 Gastro-esophageal reflux disease without esophagitis: Secondary | ICD-10-CM | POA: Insufficient documentation

## 2021-09-12 HISTORY — DX: Celiac disease: K90.0

## 2021-09-12 HISTORY — DX: Unspecified asthma, uncomplicated: J45.909

## 2021-09-12 HISTORY — PX: TONSILLECTOMY: SHX5217

## 2021-09-12 SURGERY — TONSILLECTOMY
Anesthesia: General | Laterality: Left

## 2021-09-12 MED ORDER — HYDROCODONE-ACETAMINOPHEN 7.5-325 MG/15ML PO SOLN: 10.0000 mL | Freq: Four times a day (QID) | ORAL | 0 refills | Status: DC | PRN

## 2021-09-12 MED ORDER — ONDANSETRON HCL 4 MG/2ML IJ SOLN
INTRAMUSCULAR | Status: DC | PRN
  Administered 2021-09-12: 4 mg via INTRAVENOUS

## 2021-09-12 MED ORDER — OXYCODONE HCL 5 MG PO TABS: 5.0000 mg | ORAL_TABLET | Freq: Once | ORAL | Status: DC | PRN

## 2021-09-12 MED ORDER — ACETAMINOPHEN 10 MG/ML IV SOLN
1000.0000 mg | Freq: Once | INTRAVENOUS | Status: AC
  Administered 2021-09-12: 1000 mg via INTRAVENOUS

## 2021-09-12 MED ORDER — BUPIVACAINE HCL (PF) 0.25 % IJ SOLN
INTRAMUSCULAR | Status: DC | PRN
  Administered 2021-09-12: 1 mL

## 2021-09-12 MED ORDER — LIDOCAINE VISCOUS HCL 2 % MT SOLN: 10.0000 mL | Freq: Four times a day (QID) | OROMUCOSAL | 0 refills | Status: DC | PRN

## 2021-09-12 MED ORDER — OXYCODONE HCL 5 MG/5ML PO SOLN
5.0000 mg | Freq: Once | ORAL | Status: AC | PRN
Start: 2021-09-12 — End: 2021-09-12
  Administered 2021-09-12: 5 mg via ORAL

## 2021-09-12 MED ORDER — PROPOFOL 10 MG/ML IV BOLUS
INTRAVENOUS | Status: DC | PRN
  Administered 2021-09-12: 150 mg via INTRAVENOUS

## 2021-09-12 MED ORDER — FENTANYL CITRATE (PF) 100 MCG/2ML IJ SOLN
INTRAMUSCULAR | Status: DC | PRN
  Administered 2021-09-12: 25 ug via INTRAVENOUS
  Administered 2021-09-12: 50 ug via INTRAVENOUS

## 2021-09-12 MED ORDER — OXYMETAZOLINE HCL 0.05 % NA SOLN
NASAL | Status: DC | PRN
  Administered 2021-09-12: 1

## 2021-09-12 MED ORDER — MIDAZOLAM HCL 5 MG/5ML IJ SOLN
INTRAMUSCULAR | Status: DC | PRN
  Administered 2021-09-12: 2 mg via INTRAVENOUS

## 2021-09-12 MED ORDER — OXYCODONE HCL 5 MG/5ML PO SOLN: 5.0000 mg | Freq: Once | ORAL | Status: DC | PRN

## 2021-09-12 MED ORDER — SUCCINYLCHOLINE CHLORIDE 200 MG/10ML IV SOSY
PREFILLED_SYRINGE | INTRAVENOUS | Status: DC | PRN
  Administered 2021-09-12: 100 mg via INTRAVENOUS

## 2021-09-12 MED ORDER — GLYCOPYRROLATE 0.2 MG/ML IJ SOLN
INTRAMUSCULAR | Status: DC | PRN
  Administered 2021-09-12: .1 mg via INTRAVENOUS

## 2021-09-12 MED ORDER — ONDANSETRON HCL 4 MG PO TABS: 4.0000 mg | ORAL_TABLET | Freq: Three times a day (TID) | ORAL | 0 refills | Status: DC | PRN

## 2021-09-12 MED ORDER — LIDOCAINE HCL (CARDIAC) PF 100 MG/5ML IV SOSY
PREFILLED_SYRINGE | INTRAVENOUS | Status: DC | PRN
  Administered 2021-09-12: 50 mg via INTRAVENOUS

## 2021-09-12 MED ORDER — LACTATED RINGERS IV SOLN: INTRAVENOUS | Status: DC

## 2021-09-12 MED ORDER — FENTANYL CITRATE (PF) 100 MCG/2ML IJ SOLN: 25.0000 ug | INTRAMUSCULAR | Status: DC | PRN

## 2021-09-12 MED ORDER — DEXAMETHASONE SODIUM PHOSPHATE 4 MG/ML IJ SOLN
INTRAMUSCULAR | Status: DC | PRN
  Administered 2021-09-12: 10 mg via INTRAVENOUS

## 2021-09-12 SURGICAL SUPPLY — 18 items
BLADE BOVIE TIP EXT 4 (BLADE) ×2 IMPLANT
CANISTER SUCT 1200ML W/VALVE (MISCELLANEOUS) ×2 IMPLANT
CATH ROBINSON RED A/P 10FR (CATHETERS) ×2 IMPLANT
COAG SUCT 10F 3.5MM HAND CTRL (MISCELLANEOUS) ×2 IMPLANT
ELECT REM PT RETURN 9FT ADLT (ELECTROSURGICAL) ×2
ELECTRODE REM PT RTRN 9FT ADLT (ELECTROSURGICAL) ×1 IMPLANT
GLOVE SURG GAMMEX PI TX LF 7.5 (GLOVE) ×2 IMPLANT
HANDLE SUCTION POOLE (INSTRUMENTS) ×1 IMPLANT
KIT TURNOVER KIT A (KITS) ×2 IMPLANT
NS IRRIG 500ML POUR BTL (IV SOLUTION) ×2 IMPLANT
PACK TONSIL AND ADENOID CUSTOM (PACKS) ×2 IMPLANT
PENCIL SMOKE EVACUATOR (MISCELLANEOUS) ×2 IMPLANT
SLEEVE SUCTION 125 (MISCELLANEOUS) ×2 IMPLANT
SOL ANTI-FOG 6CC FOG-OUT (MISCELLANEOUS) ×1 IMPLANT
SOL FOG-OUT ANTI-FOG 6CC (MISCELLANEOUS) ×1
STRAP BODY AND KNEE 60X3 (MISCELLANEOUS) ×2 IMPLANT
SUCTION POOLE HANDLE (INSTRUMENTS) ×2
SYR 5ML LL (SYRINGE) ×2 IMPLANT

## 2021-09-12 NOTE — Op Note (Signed)
..  09/12/2021  9:26 AM    Almyra Free  [id number]   Pre-Op Dx:  Left tonsil mass  Post-op Dx: Left tonsil mass  Proc:Left partial Tonsillectomy >12  Surg: Roney Mans Tinika Bucknam  Anes:  General Endotracheal  EBL:  <35ml  Comp:  None  Findings:  Left superior based tonsil mass removed with partial tonsillectomy of > 1/2 of left tonsil  Procedure: After the patient was identified in holding and the history and physical and consent was reviewed, the patient was taken to the operating room and placed in a supine position.  General endotracheal anesthesia was induced in the normal fashion.  At this time, the patient was rotated 45 degrees and a shoulder roll was placed.  At this time, a McIvor mouthgag was inserted into the patient's oral cavity and suspended from the Mayo stand without injury to teeth, lips, or gums.  Next a red rubber catheter was inserted into the patient left nostril for retraction of the uvula and soft palate superiorly.  Next a curved Alice clamp was attached to the patient's rleft uperior tonsillar mass/pole and retracted medially and inferiorly.  A Bovie electrocautery was used to dissect the patient's left tonsil in a subcapsular plane.  This was taken down to about the left mid tonsil and this was truncated. This demonstrated fibrotic tissue and scar to underlying musculature. Meticulous hemostasis was achieved with Bovie suction cautery.  At this time, the mouth gag was released from suspension for 1 minute.  At this time, the patient's nasal cavity and oral cavity was irrigated with sterile saline.  One ml of 0.25% Marcaine was injected into the anterior and posterior tonsillar fossa bilaterally.  Following this  The care of patient was returned to anesthesia, awakened, and transferred to recovery in stable condition.  Dispo:  PACU to home  Plan: Soft diet.  Limit exercise and strenuous activity for 2 weeks.  Fluid hydration  Recheck my office three  weeks.   Roney Mans Margaretann Abate 9:26 AM 09/12/2021

## 2021-09-12 NOTE — Anesthesia Postprocedure Evaluation (Signed)
Anesthesia Post Note  Patient: Brandon Barr  Procedure(s) Performed: TONSILLECTOMY (Left)     Patient location during evaluation: PACU Anesthesia Type: General Level of consciousness: awake and alert Pain management: pain level controlled Vital Signs Assessment: post-procedure vital signs reviewed and stable Respiratory status: spontaneous breathing, nonlabored ventilation, respiratory function stable and patient connected to nasal cannula oxygen Cardiovascular status: blood pressure returned to baseline and stable Postop Assessment: no apparent nausea or vomiting Anesthetic complications: no   No notable events documented.  Orrin Brigham

## 2021-09-12 NOTE — Anesthesia Preprocedure Evaluation (Signed)
Anesthesia Evaluation  Patient identified by MRN, date of birth, ID band Patient awake    Reviewed: NPO status   History of Anesthesia Complications Negative for: history of anesthetic complications  Airway Mallampati: II  TM Distance: >3 FB Neck ROM: full    Dental no notable dental hx.    Pulmonary asthma (mild) , former smoker,    Pulmonary exam normal        Cardiovascular Exercise Tolerance: Good negative cardio ROS Normal cardiovascular exam     Neuro/Psych adhd  negative psych ROS   GI/Hepatic Neg liver ROS, GERD  Controlled,celiac   Endo/Other  Morbid obesity (bmi 31)  Renal/GU negative Renal ROS  negative genitourinary   Musculoskeletal   Abdominal   Peds  Hematology negative hematology ROS (+)   Anesthesia Other Findings   Reproductive/Obstetrics                             Anesthesia Physical Anesthesia Plan  ASA: 2  Anesthesia Plan: General ETT   Post-op Pain Management:    Induction:   PONV Risk Score and Plan: 2 and Midazolam and Ondansetron  Airway Management Planned:   Additional Equipment:   Intra-op Plan:   Post-operative Plan:   Informed Consent: I have reviewed the patients History and Physical, chart, labs and discussed the procedure including the risks, benefits and alternatives for the proposed anesthesia with the patient or authorized representative who has indicated his/her understanding and acceptance.       Plan Discussed with: CRNA  Anesthesia Plan Comments:         Anesthesia Quick Evaluation

## 2021-09-12 NOTE — Anesthesia Procedure Notes (Signed)
Procedure Name: Intubation Date/Time: 09/12/2021 9:07 AM Performed by: Jimmy Picket, CRNA Pre-anesthesia Checklist: Patient identified, Emergency Drugs available, Suction available, Patient being monitored and Timeout performed Patient Re-evaluated:Patient Re-evaluated prior to induction Oxygen Delivery Method: Circle system utilized Preoxygenation: Pre-oxygenation with 100% oxygen Induction Type: IV induction Ventilation: Mask ventilation without difficulty Laryngoscope Size: Miller and 3 Grade View: Grade II Tube type: Oral Rae Tube size: 8.0 mm Number of attempts: 1 Airway Equipment and Method: Bougie stylet Placement Confirmation: ETT inserted through vocal cords under direct vision, positive ETCO2 and breath sounds checked- equal and bilateral Tube secured with: Tape Dental Injury: Teeth and Oropharynx as per pre-operative assessment  Comments: Grade 2 view. Passed bougie through vocal cords without difficulty.ETT passed over bougie. +/= BBS.

## 2021-09-12 NOTE — H&P (Signed)
..  History and Physical paper copy reviewed and updated date of procedure and will be scanned into system.  Patient marked on left.

## 2021-09-12 NOTE — Transfer of Care (Signed)
Immediate Anesthesia Transfer of Care Note  Patient: Brandon Barr  Procedure(s) Performed: TONSILLECTOMY (Left)  Patient Location: PACU  Anesthesia Type: General ETT  Level of Consciousness: awake, alert  and patient cooperative  Airway and Oxygen Therapy: Patient Spontanous Breathing and Patient connected to supplemental oxygen  Post-op Assessment: Post-op Vital signs reviewed, Patient's Cardiovascular Status Stable, Respiratory Function Stable, Patent Airway and No signs of Nausea or vomiting  Post-op Vital Signs: Reviewed and stable  Complications: No notable events documented.

## 2021-09-13 ENCOUNTER — Encounter: Payer: Self-pay | Admitting: Otolaryngology

## 2021-09-17 LAB — SURGICAL PATHOLOGY

## 2021-10-20 ENCOUNTER — Encounter: Payer: Self-pay | Admitting: Family Medicine

## 2021-10-22 ENCOUNTER — Other Ambulatory Visit: Payer: Self-pay | Admitting: Family Medicine

## 2021-10-22 MED ORDER — AMPHETAMINE-DEXTROAMPHETAMINE 30 MG PO TABS: 30.0000 mg | ORAL_TABLET | Freq: Two times a day (BID) | ORAL | 0 refills | Status: DC

## 2021-10-30 IMAGING — DX DG SHOULDER 2+V*R*
3 series · 3 of 3 positions shown · non-contrast
Comparison: None.

CLINICAL DATA: Right shoulder pain

EXAM:
RIGHT SHOULDER - 2+ VIEW

[shoulder axial]
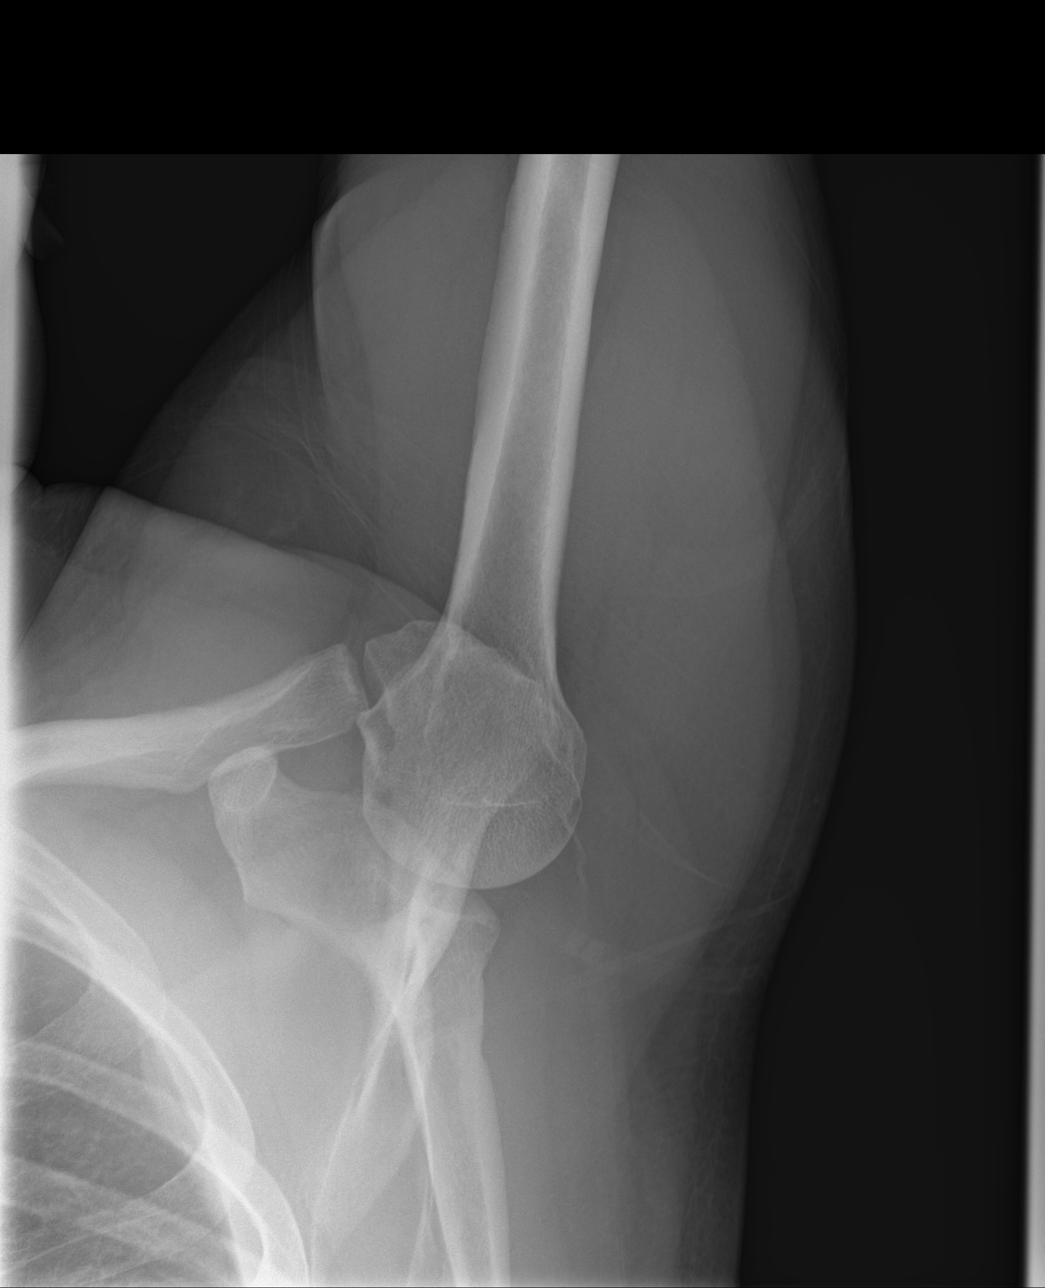

[shoulder ap]
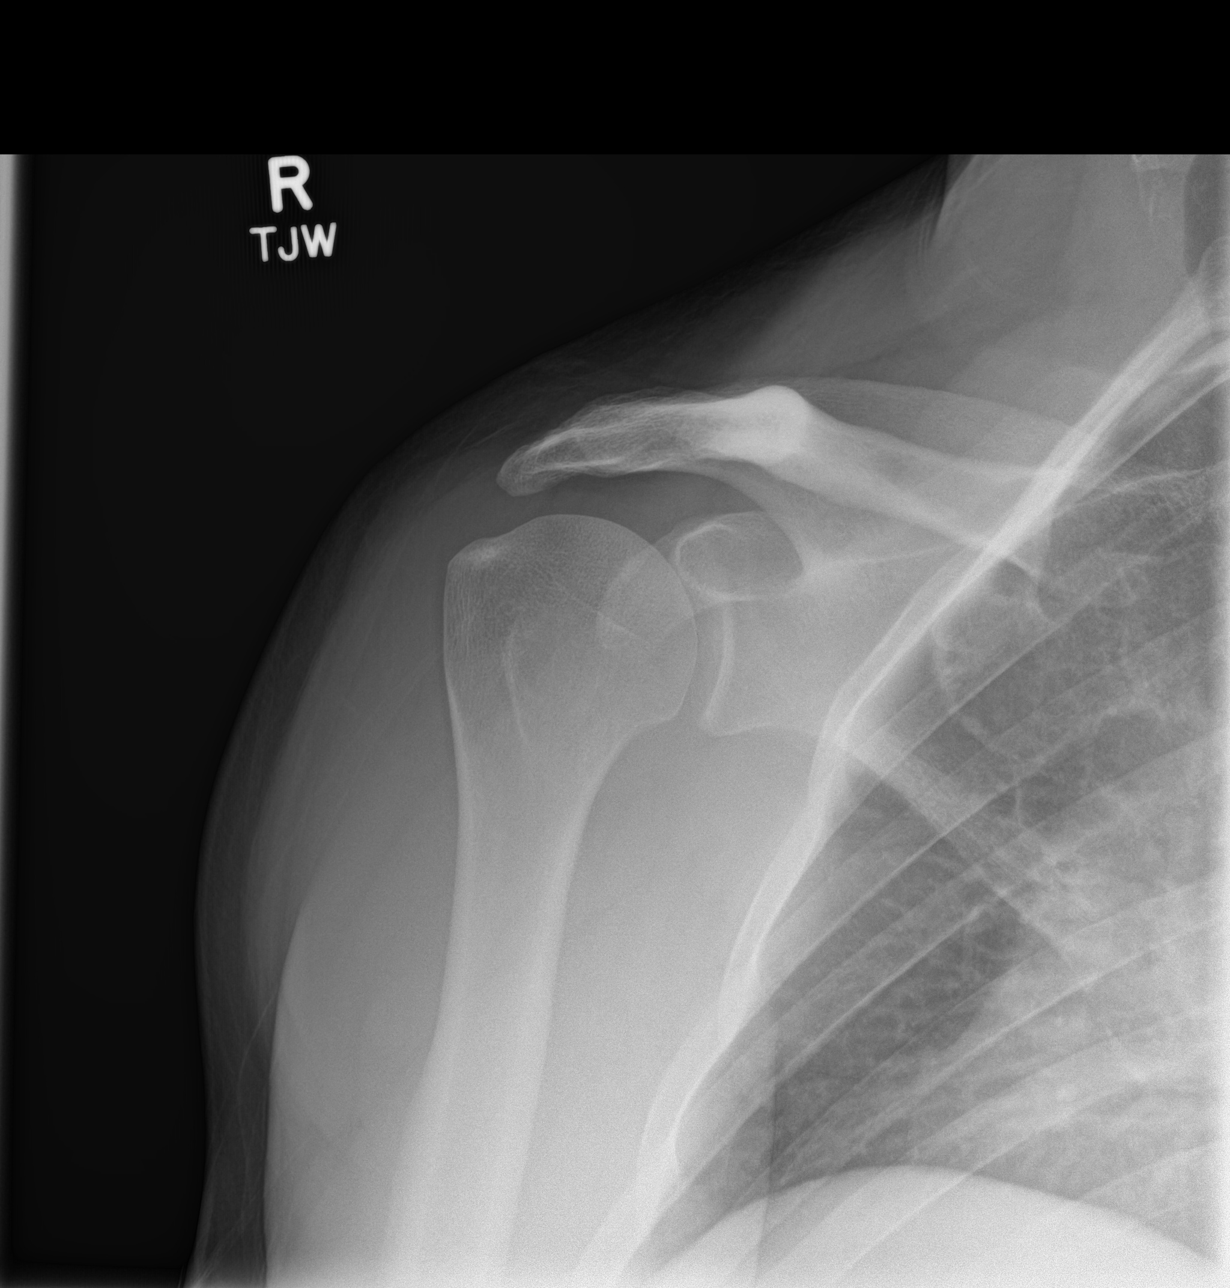

[shoulder y-view]
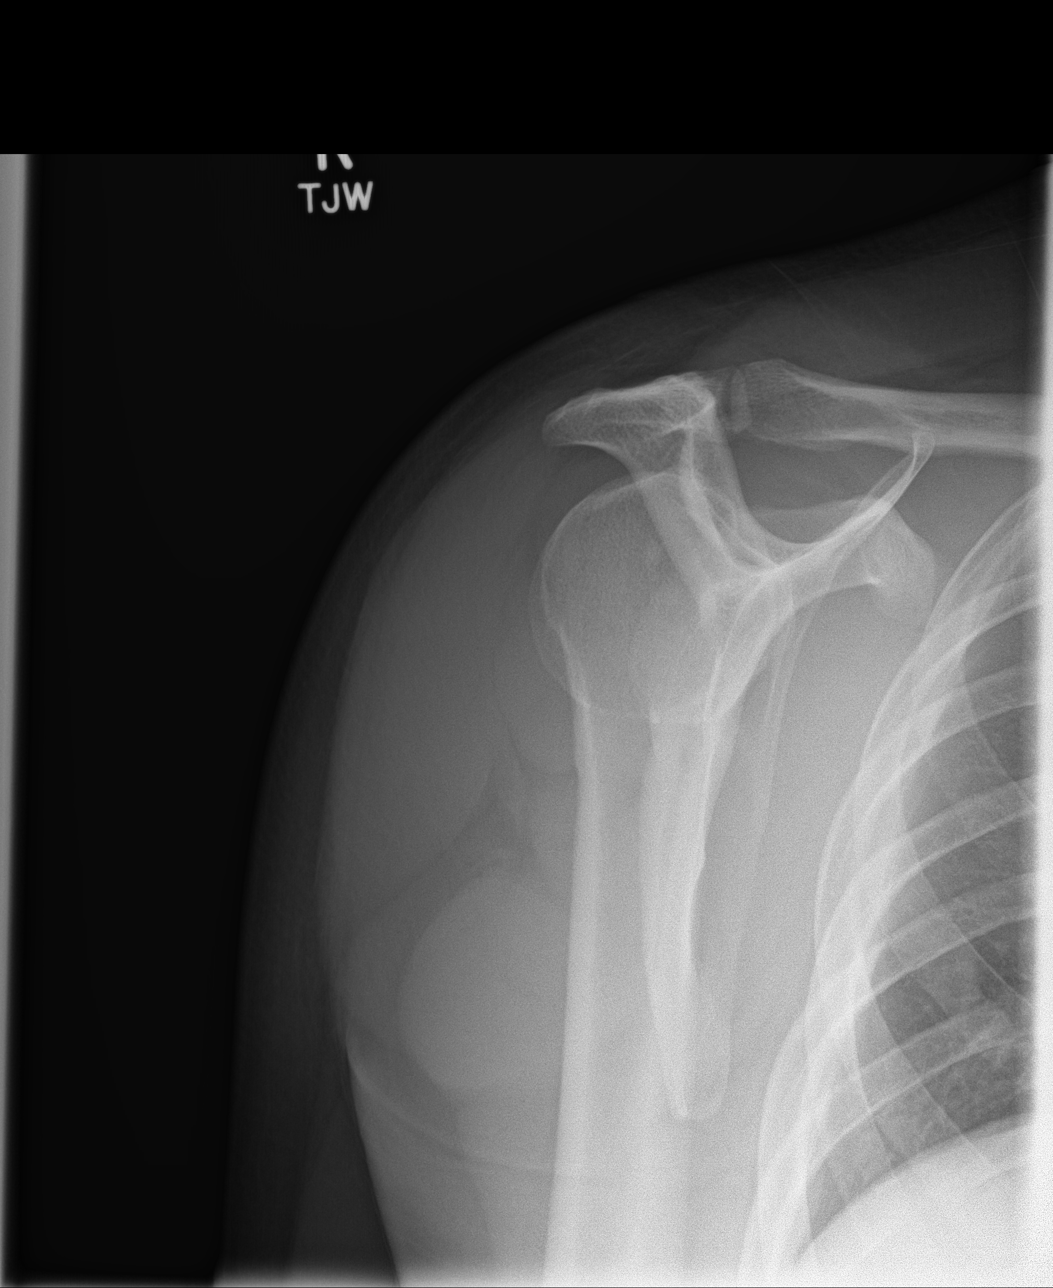

[3 of 3 positions shown; findings below may reference images not displayed]

FINDINGS: Frontal, transscapular, and axillary views of the right shoulder
demonstrate no fractures. Alignment is anatomic. Joint spaces are
well preserved. Right chest is clear.
IMPRESSION: 1. Unremarkable right shoulder.

## 2022-02-21 ENCOUNTER — Other Ambulatory Visit: Payer: Self-pay | Admitting: Family Medicine

## 2022-02-22 NOTE — Telephone Encounter (Signed)
Refill request for amphetamine-dextroamphetamine (ADDERALL) 30 MG tablet ? ?LOV - 07/27/21 ?Next OV - not scheduled ?Last refill - 10/22/21 #60/0 ? ? ?

## 2022-02-24 MED ORDER — AMPHETAMINE-DEXTROAMPHETAMINE 30 MG PO TABS: 30.0000 mg | ORAL_TABLET | Freq: Two times a day (BID) | ORAL | 0 refills | Status: DC

## 2022-02-24 NOTE — Telephone Encounter (Signed)
Sent. Thanks.   

## 2022-06-26 ENCOUNTER — Other Ambulatory Visit: Payer: Self-pay | Admitting: Family Medicine

## 2022-06-26 NOTE — Telephone Encounter (Signed)
Refill request for amphetamine-dextroamphetamine (ADDERALL) 30 MG tablet  LOV - 07/27/21 Next OV - not scheduled Last refill - 02/24/22 #60/0 x 3

## 2022-06-28 MED ORDER — AMPHETAMINE-DEXTROAMPHETAMINE 30 MG PO TABS: 30.0000 mg | ORAL_TABLET | Freq: Two times a day (BID) | ORAL | 0 refills | Status: DC

## 2022-06-28 NOTE — Telephone Encounter (Signed)
Sent. Thanks.   

## 2022-07-01 ENCOUNTER — Ambulatory Visit: Payer: BC Managed Care – PPO | Admitting: Family Medicine

## 2022-07-01 ENCOUNTER — Encounter: Payer: Self-pay | Admitting: Family Medicine

## 2022-07-01 VITALS — BP 140/82 | HR 84 | Temp 97.7°F | Ht 74.0 in | Wt 246.0 lb

## 2022-07-01 DIAGNOSIS — R202 Paresthesia of skin: Secondary | ICD-10-CM

## 2022-07-01 DIAGNOSIS — I8390 Asymptomatic varicose veins of unspecified lower extremity: Secondary | ICD-10-CM

## 2022-07-01 DIAGNOSIS — L818 Other specified disorders of pigmentation: Secondary | ICD-10-CM

## 2022-07-01 DIAGNOSIS — M79606 Pain in leg, unspecified: Secondary | ICD-10-CM | POA: Diagnosis not present

## 2022-07-01 NOTE — Progress Notes (Unsigned)
Hemosiderin staining on the R>L lower leg.  He has a varicose vein on the R leg.  He has some scratches on the skin that took longer to heal.  Noted that his feet felt cold.  R hamstring feel tight, midbody of the muscle.  He notes some widening of the toenails B.  He tried compression stockings.  No claudication.   B hemosiderin staining.  Varicose vein R leg.  Normal DP and PT pulses B.    Dec monofilament sensation in the B feet.   Cracking skin on the B heels.    R calf 42cm L calf 41cm

## 2022-07-01 NOTE — Patient Instructions (Signed)
Go to the lab on the way out.   If you have mychart we'll likely use that to update you.    We'll call about seeing the vein clinic.  Take care.  Glad to see you. Try the compression stockings in the meantime.

## 2022-07-02 LAB — D-DIMER, QUANTITATIVE: D-Dimer, Quant: <0.19 mcg/mL FEU (ref <0.50)

## 2022-07-02 LAB — COMPREHENSIVE METABOLIC PANEL
ALT: 45 U/L (ref 0–53)
AST: 24 U/L (ref 0–37)
Albumin: 4.7 g/dL (ref 3.5–5.2)
Alkaline Phosphatase: 84 U/L (ref 39–117)
BUN: 10 mg/dL (ref 6–23)
CO2: 30 mEq/L (ref 19–32)
Calcium: 9.5 mg/dL (ref 8.4–10.5)
Chloride: 102 mEq/L (ref 96–112)
Creatinine, Ser: 1.18 mg/dL (ref 0.40–1.50)
GFR: 74.86 mL/min (ref >60.00)
Glucose, Bld: 114 mg/dL — ABNORMAL HIGH (ref 70–99)
Potassium: 4.2 mEq/L (ref 3.5–5.1)
Sodium: 139 mEq/L (ref 135–145)
Total Bilirubin: 1.4 mg/dL — ABNORMAL HIGH (ref 0.2–1.2)
Total Protein: 6.7 g/dL (ref 6.0–8.3)

## 2022-07-02 LAB — CBC WITH DIFFERENTIAL/PLATELET
Basophils Absolute: 0.1 10*3/uL (ref 0.0–0.1)
Basophils Relative: 1 % (ref 0.0–3.0)
Eosinophils Absolute: 0.1 10*3/uL (ref 0.0–0.7)
Eosinophils Relative: 1.3 % (ref 0.0–5.0)
HCT: 47.6 % (ref 39.0–52.0)
Hemoglobin: 16.8 g/dL (ref 13.0–17.0)
Lymphocytes Relative: 36.7 % (ref 12.0–46.0)
Lymphs Abs: 2.4 10*3/uL (ref 0.7–4.0)
MCHC: 35.2 g/dL (ref 30.0–36.0)
MCV: 87.8 fl (ref 78.0–100.0)
Monocytes Absolute: 0.6 10*3/uL (ref 0.1–1.0)
Monocytes Relative: 8.6 % (ref 3.0–12.0)
Neutro Abs: 3.4 10*3/uL (ref 1.4–7.7)
Neutrophils Relative %: 52.4 % (ref 43.0–77.0)
Platelets: 214 10*3/uL (ref 150.0–400.0)
RBC: 5.42 Mil/uL (ref 4.22–5.81)
RDW: 13.1 % (ref 11.5–15.5)
WBC: 6.5 10*3/uL (ref 4.0–10.5)

## 2022-07-02 LAB — VITAMIN B12: Vitamin B-12: 191 pg/mL — ABNORMAL LOW (ref 211–911)

## 2022-07-02 LAB — CK: Total CK: 75 U/L (ref 7–232)

## 2022-07-02 LAB — TSH: TSH: 1.4 u[IU]/mL (ref 0.35–5.50)

## 2022-07-03 ENCOUNTER — Other Ambulatory Visit: Payer: Self-pay | Admitting: Family Medicine

## 2022-07-03 DIAGNOSIS — I8390 Asymptomatic varicose veins of unspecified lower extremity: Secondary | ICD-10-CM | POA: Insufficient documentation

## 2022-07-03 DIAGNOSIS — R202 Paresthesia of skin: Secondary | ICD-10-CM | POA: Insufficient documentation

## 2022-07-03 DIAGNOSIS — I83813 Varicose veins of bilateral lower extremities with pain: Secondary | ICD-10-CM | POA: Insufficient documentation

## 2022-07-03 DIAGNOSIS — E538 Deficiency of other specified B group vitamins: Secondary | ICD-10-CM | POA: Insufficient documentation

## 2022-07-03 DIAGNOSIS — M79606 Pain in leg, unspecified: Secondary | ICD-10-CM | POA: Insufficient documentation

## 2022-07-03 MED ORDER — CYANOCOBALAMIN 1000 MCG/ML IJ SOLN: INTRAMUSCULAR | 0 refills | Status: DC

## 2022-07-03 NOTE — Assessment & Plan Note (Signed)
Reasonable to continue compression stockings and refer to vein clinic.  Referral ordered.

## 2022-07-03 NOTE — Assessment & Plan Note (Signed)
Of unclear source.  I think it makes sense to check routine labs.  See notes on labs.  He has normal pulses.  He has minimal calf circumference differential and we talked about checking a D-dimer though I do not suspect a clot.

## 2022-07-03 NOTE — Assessment & Plan Note (Signed)
Continue compression stockings and refer to vascular clinic.

## 2022-07-04 ENCOUNTER — Ambulatory Visit (INDEPENDENT_AMBULATORY_CARE_PROVIDER_SITE_OTHER): Payer: BC Managed Care – PPO

## 2022-07-04 DIAGNOSIS — E538 Deficiency of other specified B group vitamins: Secondary | ICD-10-CM | POA: Diagnosis not present

## 2022-07-04 MED ORDER — CYANOCOBALAMIN 1000 MCG/ML IJ SOLN
1000.0000 ug | Freq: Once | INTRAMUSCULAR | Status: AC
  Administered 2022-07-04: 1000 ug via INTRAMUSCULAR

## 2022-07-04 NOTE — Progress Notes (Signed)
Per orders of Dr. Duncan, injection of vit B12 given by Laryn Venning. Patient tolerated injection well.  

## 2022-07-11 ENCOUNTER — Ambulatory Visit (INDEPENDENT_AMBULATORY_CARE_PROVIDER_SITE_OTHER): Payer: BC Managed Care – PPO

## 2022-07-11 DIAGNOSIS — E538 Deficiency of other specified B group vitamins: Secondary | ICD-10-CM | POA: Diagnosis not present

## 2022-07-11 MED ORDER — CYANOCOBALAMIN 1000 MCG/ML IJ SOLN
1000.0000 ug | Freq: Once | INTRAMUSCULAR | Status: AC
  Administered 2022-07-11: 1000 ug via INTRAMUSCULAR

## 2022-07-11 NOTE — Progress Notes (Signed)
Per orders of Dr. Para March, #2 of 4 weekly injections of B12 1000 mcg/ml given by Eual Fines, CMA in Right Deltoid. Patient tolerated injection well.

## 2022-07-18 ENCOUNTER — Ambulatory Visit (INDEPENDENT_AMBULATORY_CARE_PROVIDER_SITE_OTHER): Payer: BC Managed Care – PPO

## 2022-07-18 DIAGNOSIS — E538 Deficiency of other specified B group vitamins: Secondary | ICD-10-CM

## 2022-07-18 MED ORDER — CYANOCOBALAMIN 1000 MCG/ML IJ SOLN
1000.0000 ug | Freq: Once | INTRAMUSCULAR | Status: AC
  Administered 2022-07-18: 1000 ug via INTRAMUSCULAR

## 2022-07-18 NOTE — Progress Notes (Signed)
Per orders of Dr. Copland, injection of vit B12 given by Stella Encarnacion. Patient tolerated injection well.  

## 2022-07-25 ENCOUNTER — Ambulatory Visit (INDEPENDENT_AMBULATORY_CARE_PROVIDER_SITE_OTHER): Payer: BC Managed Care – PPO

## 2022-07-25 DIAGNOSIS — E538 Deficiency of other specified B group vitamins: Secondary | ICD-10-CM

## 2022-07-25 MED ORDER — CYANOCOBALAMIN 1000 MCG/ML IJ SOLN
1000.0000 ug | Freq: Once | INTRAMUSCULAR | Status: AC
Start: 2022-07-25 — End: 2022-07-25
  Administered 2022-07-25: 1000 ug via INTRAMUSCULAR

## 2022-07-25 NOTE — Progress Notes (Signed)
Per orders of Dr. Para March, injection of #4 of 4 weekly B12 1000 mcg/ml given by Eual Fines, CMA in Right Deltoid. Patient tolerated injection well.

## 2022-09-03 ENCOUNTER — Ambulatory Visit (INDEPENDENT_AMBULATORY_CARE_PROVIDER_SITE_OTHER): Payer: BC Managed Care – PPO | Admitting: Vascular Surgery

## 2022-09-03 ENCOUNTER — Encounter (INDEPENDENT_AMBULATORY_CARE_PROVIDER_SITE_OTHER): Payer: Self-pay | Admitting: Vascular Surgery

## 2022-09-03 VITALS — BP 155/102 | HR 97 | Resp 18 | Ht 73.0 in | Wt 242.4 lb

## 2022-09-03 DIAGNOSIS — I83813 Varicose veins of bilateral lower extremities with pain: Secondary | ICD-10-CM

## 2022-09-03 DIAGNOSIS — I872 Venous insufficiency (chronic) (peripheral): Secondary | ICD-10-CM | POA: Diagnosis not present

## 2022-09-03 NOTE — Assessment & Plan Note (Signed)
More noticeable over the last few years

## 2022-09-03 NOTE — Progress Notes (Signed)
Patient ID: Brandon Barr, male   DOB: 09-28-1976, 46 y.o.   MRN: [id number]  No chief complaint on file.   HPI Brandon Barr is a 46 y.o. male.  I am asked to see the patient by Dr. Para March for evaluation of leg pain, stasis dermatitis changes, and noticing varicose veins of the lower extremities.  The patient presents with complaints of symptomatic varicosities of the lower extremities a little more on the right than the left. The patient reports a long standing history of varicosities and they have become painful over time. There was no clear inciting event or causative factor that started the symptoms although he first began noticing stasis dermatitis changes and heaviness in his legs after his COVID vaccination a few years ago.  The right leg is more severly affected. The patient elevates the legs for relief. The pain is described as aching and heaviness in the legs more than an overt pain. The symptoms are generally most severe in the evening, particularly when they have been on their feet for long periods of time.  Activity and elevation has been used to try to improve the symptoms with limited success. The patient complains of some swelling as an associated symptom. The patient has no previous history of deep venous thrombosis or superficial thrombophlebitis to their knowledge.     Past Medical History:  Diagnosis Date   ADHD (attention deficit hyperactivity disorder)    Asthma    "laughing asthma"   Celiac disease    GERD (gastroesophageal reflux disease)    Meningitis, viral    2004    Past Surgical History:  Procedure Laterality Date   MENISECTOMY     left knee and plica removed   TONSILLECTOMY Left 09/12/2021   Procedure: TONSILLECTOMY;  Surgeon: Bud Face, MD;  Location: [facility];  Service: ENT;  Laterality: Left;    Family History  Problem Relation Age of Onset   ADD / ADHD Mother    Celiac disease Mother    Heart attack Maternal  Grandmother       Social History   Tobacco Use   Smoking status: Former    Types: Cigarettes    Quit date: 2006    Years since quitting: 17.7   Smokeless tobacco: Former    Types: Chew    Quit date: 2002   Tobacco comments:    quit 2006  Vaping Use   Vaping Use: Never used  Substance Use Topics   Alcohol use: Yes    Comment: very rarely   Drug use: No     Allergies  Allergen Reactions   Shellfish Allergy Anaphylaxis   Gluten Meal     Has celiac disease    Current Outpatient Medications  Medication Sig Dispense Refill   albuterol (VENTOLIN HFA) 108 (90 Base) MCG/ACT inhaler Inhale 1-2 puffs into the lungs every 6 (six) hours as needed for shortness of breath. 1 each 0   amphetamine-dextroamphetamine (ADDERALL) 30 MG tablet Take 1 tablet by mouth 2 (two) times daily. 60 tablet 0   cyanocobalamin (VITAMIN B12) 1000 MCG/ML injection 1000 mcg injected weekly x4 weeks then monthly thereafter.  0   EPINEPHrine (EPIPEN 2-PAK) 0.3 mg/0.3 mL IJ SOAJ injection Inject 0.3 mg into the muscle as needed for anaphylaxis.     Multiple Vitamin (MULTIVITAMIN) tablet Take 1 tablet by mouth daily.     Nattokinase 100 MG CAPS Take 200 mg by mouth.     VITAMIN D PO Take  by mouth.     No current facility-administered medications for this visit.      REVIEW OF SYSTEMS (Negative unless checked)  Constitutional: [] Weight loss  [] Fever  [] Chills Cardiac: [] Chest pain   [] Chest pressure   [] Palpitations   [] Shortness of breath when laying flat   [] Shortness of breath at rest   [] Shortness of breath with exertion. Vascular:  [] Pain in legs with walking   [] Pain in legs at rest   [] Pain in legs when laying flat   [] Claudication   [] Pain in feet when walking  [] Pain in feet at rest  [] Pain in feet when laying flat   [] History of DVT   [] Phlebitis   [x] Swelling in legs   [x] Varicose veins   [] Non-healing ulcers Pulmonary:   [] Uses home oxygen   [] Productive cough   [] Hemoptysis   [] Wheeze  [] COPD    [x] Asthma Neurologic:  [] Dizziness  [] Blackouts   [] Seizures   [] History of stroke   [] History of TIA  [] Aphasia   [] Temporary blindness   [] Dysphagia   [] Weakness or numbness in arms   [] Weakness or numbness in legs Musculoskeletal:  [] Arthritis   [] Joint swelling   [] Joint pain   [] Low back pain Hematologic:  [] Easy bruising  [] Easy bleeding   [] Hypercoagulable state   [] Anemic  [] Hepatitis Gastrointestinal:  [] Blood in stool   [] Vomiting blood  [x] Gastroesophageal reflux/heartburn   [] Abdominal pain Genitourinary:  [] Chronic kidney disease   [] Difficult urination  [] Frequent urination  [] Burning with urination   [] Hematuria Skin:  [] Rashes   [] Ulcers   [] Wounds Psychological:  [] History of anxiety   []  History of major depression.    Physical Exam BP (!) 155/102 (BP Location: Right Arm)   Pulse 97   Resp 18   Ht 6\' 1"  (1.854 m)   Wt 242 lb 6.4 oz (110 kg)   BMI 31.98 kg/m  Gen:  WD/WN, NAD Head: [place]/AT, No temporalis wasting.  Ear/Nose/Throat: Hearing grossly intact, dentition good Eyes: Sclera non-icteric. Conjunctiva clear Neck: Supple. Trachea midline Pulmonary:  Good air movement, no use of accessory muscles, respirations not labored.  Cardiac: RRR, No JVD Vascular: Varicosities scattered and measuring up to 1-2 mm in the right lower extremity        Varicosities scattered and measuring up to 1 mm in the left lower extremity Vessel Right Left  Radial Palpable Palpable                          PT Palpable Palpable  DP Palpable Palpable   Gastrointestinal: soft, non-tender/non-distended.  Musculoskeletal: M/S 5/5 throughout.  No obvious edema today Neurologic: Sensation grossly intact in extremities.  Symmetrical.  Speech is fluent.  Psychiatric: Judgment intact, Mood & affect appropriate for pt's clinical situation. Dermatologic: No rashes or ulcers noted.  No cellulitis or open wounds.    Radiology No results found.  Labs Recent Results (from the past 2160  hour(s))  Comprehensive metabolic panel     Status: Abnormal   Collection Time: 07/01/22  2:47 PM  Result Value Ref Range   Sodium 139 135 - 145 mEq/L   Potassium 4.2 3.5 - 5.1 mEq/L   Chloride 102 96 - 112 mEq/L   CO2 30 19 - 32 mEq/L   Glucose, Bld 114 (H) 70 - 99 mg/dL   BUN 10 6 - 23 mg/dL   Creatinine, Ser 1.18 0.40 - 1.50 mg/dL   Total Bilirubin 1.4 (H) 0.2 - 1.2 mg/dL  Alkaline Phosphatase 84 39 - 117 U/L   AST 24 0 - 37 U/L   ALT 45 0 - 53 U/L   Total Protein 6.7 6.0 - 8.3 g/dL   Albumin 4.7 3.5 - 5.2 g/dL   GFR 84.69 >62.95 mL/min    Comment: Calculated using the CKD-EPI Creatinine Equation (2021)   Calcium 9.5 8.4 - 10.5 mg/dL  CBC with Differential/Platelet     Status: None   Collection Time: 07/01/22  2:47 PM  Result Value Ref Range   WBC 6.5 4.0 - 10.5 K/uL   RBC 5.42 4.22 - 5.81 Mil/uL   Hemoglobin 16.8 13.0 - 17.0 g/dL   HCT 28.4 13.2 - 44.0 %   MCV 87.8 78.0 - 100.0 fl   MCHC 35.2 30.0 - 36.0 g/dL   RDW 10.2 72.5 - 36.6 %   Platelets 214.0 150.0 - 400.0 K/uL   Neutrophils Relative % 52.4 43.0 - 77.0 %   Lymphocytes Relative 36.7 12.0 - 46.0 %   Monocytes Relative 8.6 3.0 - 12.0 %   Eosinophils Relative 1.3 0.0 - 5.0 %   Basophils Relative 1.0 0.0 - 3.0 %   Neutro Abs 3.4 1.4 - 7.7 K/uL   Lymphs Abs 2.4 0.7 - 4.0 K/uL   Monocytes Absolute 0.6 0.1 - 1.0 K/uL   Eosinophils Absolute 0.1 0.0 - 0.7 K/uL   Basophils Absolute 0.1 0.0 - 0.1 K/uL  TSH     Status: None   Collection Time: 07/01/22  2:47 PM  Result Value Ref Range   TSH 1.40 0.35 - 5.50 uIU/mL  Vitamin B12     Status: Abnormal   Collection Time: 07/01/22  2:47 PM  Result Value Ref Range   Vitamin B-12 191 (L) 211 - 911 pg/mL  CK     Status: None   Collection Time: 07/01/22  2:47 PM  Result Value Ref Range   Total CK 75 7 - 232 U/L  D-dimer, quantitative     Status: None   Collection Time: 07/01/22  2:47 PM  Result Value Ref Range   D-Dimer, Quant <0.19 <0.50 mcg/mL FEU    Comment: . The  D-Dimer test is used frequently to exclude an acute PE or DVT. In patients with a low to moderate clinical risk assessment and a D-Dimer result <0.50 mcg/mL FEU, the likelihood of a PE or DVT is very low. However, a thromboembolic event should not be excluded solely on the basis of the D-Dimer level. Increased levels of D-Dimer are associated with a PE, DVT, DIC, malignancies, inflammation, sepsis, surgery, trauma, pregnancy, and advancing patient age. [Jama 2006 11:295(2):199-207] . For additional information, please refer to: http://education.questdiagnostics.com/faq/FAQ149 (This link is being provided for informational/ educational purposes only) .     Assessment/Plan:  Venous stasis dermatitis of both lower extremities More noticeable over the last few years  Varicose veins of leg with pain, bilateral    The patient has symptoms consistent with chronic venous insufficiency. We discussed the natural history and treatment options for venous disease. I recommended the regular use of 20 - 30 mm Hg compression stockings, and prescribed these today. I recommended leg elevation and anti-inflammatories as needed for pain. I have also recommended a complete venous duplex to assess the venous system for reflux or thrombotic issues. This can be done at the patient's convenience. I will see the patient back after the duplex to assess the response to conservative management, and determine further treatment options.     Festus Barren  09/03/2022, 2:33 PM   This note was created with Dragon medical transcription system.  Any errors from dictation are unintentional.

## 2022-09-04 ENCOUNTER — Ambulatory Visit (INDEPENDENT_AMBULATORY_CARE_PROVIDER_SITE_OTHER): Payer: BC Managed Care – PPO | Admitting: Nurse Practitioner

## 2022-09-04 ENCOUNTER — Ambulatory Visit (INDEPENDENT_AMBULATORY_CARE_PROVIDER_SITE_OTHER): Payer: BC Managed Care – PPO

## 2022-09-04 ENCOUNTER — Encounter (INDEPENDENT_AMBULATORY_CARE_PROVIDER_SITE_OTHER): Payer: Self-pay | Admitting: Nurse Practitioner

## 2022-09-04 DIAGNOSIS — I83813 Varicose veins of bilateral lower extremities with pain: Secondary | ICD-10-CM

## 2022-09-05 ENCOUNTER — Encounter (INDEPENDENT_AMBULATORY_CARE_PROVIDER_SITE_OTHER): Payer: Self-pay | Admitting: Nurse Practitioner

## 2022-09-05 ENCOUNTER — Ambulatory Visit (INDEPENDENT_AMBULATORY_CARE_PROVIDER_SITE_OTHER): Payer: BC Managed Care – PPO

## 2022-09-05 DIAGNOSIS — E538 Deficiency of other specified B group vitamins: Secondary | ICD-10-CM | POA: Diagnosis not present

## 2022-09-05 MED ORDER — CYANOCOBALAMIN 1000 MCG/ML IJ SOLN
1000.0000 ug | Freq: Once | INTRAMUSCULAR | Status: AC
  Administered 2022-09-05: 1000 ug via INTRAMUSCULAR

## 2022-09-05 NOTE — Progress Notes (Signed)
Per orders of Dr. Duncan, injection of vit B12 given by Andreia Gandolfi. Patient tolerated injection well.  

## 2022-09-05 NOTE — Progress Notes (Signed)
Subjective:    Patient ID: Brandon Barr, male    DOB: 05/10/1976, 46 y.o.   MRN: [id number] No chief complaint on file.   Brandon Barr is a 46 y.o. male.  I am asked to see the patient by Dr. Damita Dunnings for evaluation of leg pain, stasis dermatitis changes, and noticing varicose veins of the lower extremities.  The patient presents with complaints of symptomatic varicosities of the lower extremities a little more on the right than the left. The patient reports a long standing history of varicosities and they have become painful over time. There was no clear inciting event or causative factor that started the symptoms although he first began noticing stasis dermatitis changes and heaviness in his legs after his COVID vaccination a few years ago.  The right leg is more severly affected. The patient elevates the legs for relief. The pain is described as aching and heaviness in the legs more than an overt pain. The symptoms are generally most severe in the evening, particularly when they have been on their feet for long periods of time.  Activity and elevation has been used to try to improve the symptoms with limited success. The patient complains of some swelling as an associated symptom. The patient has no previous history of deep venous thrombosis or superficial thrombophlebitis to their knowledge.  The patient has worn medical grade although not on a consistent basis.  Today noninvasive studies show no evidence of reflux in the bilateral great saphenous veins with the left being worse than the right.  No evidence of DVT or superficial thrombophlebitis bilaterally.    Review of Systems  Cardiovascular:  Negative for leg swelling.  All other systems reviewed and are negative.      Objective:   Physical Exam Vitals reviewed.  HENT:     Head: Normocephalic.  Cardiovascular:     Rate and Rhythm: Normal rate.     Pulses: Normal pulses.  Pulmonary:     Effort: Pulmonary effort  is normal.  Skin:    General: Skin is warm and dry.     Comments: Bilateral stasis dermatitis  Neurological:     Mental Status: He is alert and oriented to person, place, and time.  Psychiatric:        Mood and Affect: Mood normal.        Behavior: Behavior normal.        Thought Content: Thought content normal.        Judgment: Judgment normal.     BP 130/86 (BP Location: Right Arm)   Pulse 76   Resp 18   Ht 6\' 1"  (1.854 m)   Wt 242 lb (109.8 kg)   BMI 31.93 kg/m   Past Medical History:  Diagnosis Date   ADHD (attention deficit hyperactivity disorder)    Asthma    "laughing asthma"   Celiac disease    GERD (gastroesophageal reflux disease)    Meningitis, viral    2004    Social History   Socioeconomic History   Marital status: Married    Spouse name: Not on file   Number of children: 1   Years of education: Not on file   Highest education level: Not on file  Occupational History   Occupation: Insurance underwriter    Employer: [employer]  Tobacco Use   Smoking status: Former    Types: Cigarettes    Quit date: 2006    Years since quitting: 17.7   Smokeless tobacco: Former  Types: Sarina Ser    Quit date: 2002   Tobacco comments:    quit 2006  Vaping Use   Vaping Use: Never used  Substance and Sexual Activity   Alcohol use: Yes    Comment: very rarely   Drug use: No   Sexual activity: Not on file  Other Topics Concern   Not on file  Social History Narrative   From [place]   Enjoys welding/fabrications   Working at [employer]   1 daughter, 1 son   Social Determinants of Health   Financial Resource Strain: Not on file  Food Insecurity: Not on file  Transportation Needs: Not on file  Physical Activity: Not on file  Stress: Not on file  Social Connections: Not on file  Intimate Partner Violence: Not on file    Past Surgical History:  Procedure Laterality Date   MENISECTOMY     left knee and plica removed   TONSILLECTOMY Left 09/12/2021    Procedure: TONSILLECTOMY;  Surgeon: Carloyn Manner, MD;  Location: [place];  Service: ENT;  Laterality: Left;    Family History  Problem Relation Age of Onset   ADD / ADHD Mother    Celiac disease Mother    Heart attack Maternal Grandmother     Allergies  Allergen Reactions   Shellfish Allergy Anaphylaxis   Gluten Meal     Has celiac disease       Latest Ref Rng & Units 07/01/2022    2:47 PM 07/27/2021    3:48 PM 05/01/2020   12:10 PM  CBC  WBC 4.0 - 10.5 K/uL 6.5  6.9  6.4   Hemoglobin 13.0 - 17.0 g/dL 16.8  17.3  17.3   Hematocrit 39.0 - 52.0 % 47.6  48.9  49.6   Platelets 150.0 - 400.0 K/uL 214.0  230  198.0       CMP     Component Value Date/Time   NA 139 07/01/2022 1447   K 4.2 07/01/2022 1447   CL 102 07/01/2022 1447   CO2 30 07/01/2022 1447   GLUCOSE 114 (H) 07/01/2022 1447   BUN 10 07/01/2022 1447   CREATININE 1.18 07/01/2022 1447   CREATININE 1.28 07/27/2021 1548   CALCIUM 9.5 07/01/2022 1447   PROT 6.7 07/01/2022 1447   ALBUMIN 4.7 07/01/2022 1447   AST 24 07/01/2022 1447   ALT 45 07/01/2022 1447   ALKPHOS 84 07/01/2022 1447   BILITOT 1.4 (H) 07/01/2022 1447   GFRNONAA 84 02/23/2008 0922   GFRAA 101 02/23/2008 0922     No results found.     Assessment & Plan:   1. Varicose veins of leg with pain, bilateral  Recommend:  The patient has large symptomatic varicose veins that are painful and associated with swelling.  I have had a long discussion with the patient regarding  varicose veins and why they cause symptoms.  Patient will begin wearing graduated compression stockings class 1 on a daily basis, beginning first thing in the morning and removing them in the evening. The patient is instructed specifically not to sleep in the stockings.    The patient  will also begin using over-the-counter analgesics such as Motrin 600 mg po TID to help control the symptoms.    In addition, behavioral modification including elevation during the  day will be initiated.    Further plans will be based on the ultrasound results and whether conservative therapies are successful at eliminating the pain and swelling.   - VAS  [place] LOWER EXTREMITY VENOUS REFLUX   Current Outpatient Medications on File Prior to Visit  Medication Sig Dispense Refill   albuterol (VENTOLIN HFA) 108 (90 Base) MCG/ACT inhaler Inhale 1-2 puffs into the lungs every 6 (six) hours as needed for shortness of breath. 1 each 0   amphetamine-dextroamphetamine (ADDERALL) 30 MG tablet Take 1 tablet by mouth 2 (two) times daily. 60 tablet 0   cyanocobalamin (VITAMIN B12) 1000 MCG/ML injection 1000 mcg injected weekly x4 weeks then monthly thereafter.  0   EPINEPHrine (EPIPEN 2-PAK) 0.3 mg/0.3 mL IJ SOAJ injection Inject 0.3 mg into the muscle as needed for anaphylaxis.     Multiple Vitamin (MULTIVITAMIN) tablet Take 1 tablet by mouth daily.     Nattokinase 100 MG CAPS Take 200 mg by mouth.     VITAMIN D PO Take by mouth.     No current facility-administered medications on file prior to visit.    There are no Patient Instructions on file for this visit. No follow-ups on file.   Kris Hartmann, NP

## 2022-09-22 ENCOUNTER — Other Ambulatory Visit: Payer: Self-pay | Admitting: Family Medicine

## 2022-09-22 ENCOUNTER — Other Ambulatory Visit: Payer: Self-pay | Admitting: Nurse Practitioner

## 2022-09-22 DIAGNOSIS — J0140 Acute pansinusitis, unspecified: Secondary | ICD-10-CM

## 2022-09-22 DIAGNOSIS — J209 Acute bronchitis, unspecified: Secondary | ICD-10-CM

## 2022-09-23 NOTE — Telephone Encounter (Signed)
Refill request for amphetamine-dextroamphetamine (ADDERALL) 30 MG tablet  LOV - 07/01/22 Next OV - not scheduled Last refill - 06/28/22 #60/0

## 2022-09-24 MED ORDER — AMPHETAMINE-DEXTROAMPHETAMINE 30 MG PO TABS: 30.0000 mg | ORAL_TABLET | Freq: Two times a day (BID) | ORAL | 0 refills | Status: DC

## 2022-09-24 MED ORDER — ALBUTEROL SULFATE HFA 108 (90 BASE) MCG/ACT IN AERS: 1.0000 | INHALATION_SPRAY | Freq: Four times a day (QID) | RESPIRATORY_TRACT | 0 refills | Status: DC | PRN

## 2022-10-03 ENCOUNTER — Other Ambulatory Visit: Payer: BC Managed Care – PPO

## 2022-10-03 ENCOUNTER — Other Ambulatory Visit (INDEPENDENT_AMBULATORY_CARE_PROVIDER_SITE_OTHER): Payer: BC Managed Care – PPO

## 2022-10-03 ENCOUNTER — Ambulatory Visit (INDEPENDENT_AMBULATORY_CARE_PROVIDER_SITE_OTHER): Payer: BC Managed Care – PPO

## 2022-10-03 DIAGNOSIS — E538 Deficiency of other specified B group vitamins: Secondary | ICD-10-CM

## 2022-10-03 LAB — VITAMIN B12: Vitamin B-12: 299 pg/mL (ref 211–911)

## 2022-10-03 MED ORDER — CYANOCOBALAMIN 1000 MCG/ML IJ SOLN
1000.0000 ug | Freq: Once | INTRAMUSCULAR | Status: AC
  Administered 2022-10-03: 1000 ug via INTRAMUSCULAR

## 2022-10-03 NOTE — Progress Notes (Signed)
Per orders of Dr. Duncan, injection of vit B12 given by Eligio Angert. Patient tolerated injection well.  

## 2022-10-07 ENCOUNTER — Encounter (INDEPENDENT_AMBULATORY_CARE_PROVIDER_SITE_OTHER): Payer: Self-pay

## 2022-10-08 ENCOUNTER — Other Ambulatory Visit: Payer: Self-pay | Admitting: Family Medicine

## 2022-10-08 ENCOUNTER — Encounter: Payer: Self-pay | Admitting: Family Medicine

## 2022-10-08 DIAGNOSIS — E538 Deficiency of other specified B group vitamins: Secondary | ICD-10-CM

## 2022-10-08 MED ORDER — CYANOCOBALAMIN 1000 MCG/ML IJ SOLN: INTRAMUSCULAR | 0 refills | Status: DC

## 2022-10-09 NOTE — Telephone Encounter (Signed)
Dr. Damita Dunnings usually allows patients to do their own injections at home if they know how or have someone at home that can do them for them. Can you send B12 in for patient to do at home?

## 2022-10-15 NOTE — Telephone Encounter (Signed)
Will forward to PCP 

## 2022-10-16 ENCOUNTER — Other Ambulatory Visit: Payer: Self-pay | Admitting: Family Medicine

## 2022-10-16 DIAGNOSIS — E538 Deficiency of other specified B group vitamins: Secondary | ICD-10-CM

## 2022-10-16 MED ORDER — CYANOCOBALAMIN 1000 MCG/ML IJ SOLN: INTRAMUSCULAR | 3 refills | Status: DC

## 2022-10-16 MED ORDER — "BD INTEGRA SYRINGE 25G X 1"" 3 ML MISC": 1 refills | Status: DC

## 2022-10-17 ENCOUNTER — Telehealth: Payer: Self-pay | Admitting: Family Medicine

## 2022-10-17 NOTE — Telephone Encounter (Signed)
Called [facility] and advised that B12 is for IM use.

## 2022-10-17 NOTE — Telephone Encounter (Signed)
Brandon Barr from [facility] called and stated that she need to know the medication B12 is IM or subcutaneous. Call back number [phone].

## 2022-12-10 ENCOUNTER — Ambulatory Visit (INDEPENDENT_AMBULATORY_CARE_PROVIDER_SITE_OTHER): Payer: 59 | Admitting: Vascular Surgery

## 2022-12-10 ENCOUNTER — Encounter (INDEPENDENT_AMBULATORY_CARE_PROVIDER_SITE_OTHER): Payer: Self-pay | Admitting: Vascular Surgery

## 2022-12-10 VITALS — BP 136/86 | HR 74 | Resp 16 | Wt 243.0 lb

## 2022-12-10 DIAGNOSIS — I83813 Varicose veins of bilateral lower extremities with pain: Secondary | ICD-10-CM | POA: Diagnosis not present

## 2022-12-10 DIAGNOSIS — I872 Venous insufficiency (chronic) (peripheral): Secondary | ICD-10-CM

## 2022-12-10 NOTE — Assessment & Plan Note (Signed)
Recommend  I have reviewed my previous  discussion with the patient regarding  varicose veins and why they cause symptoms. Patient will continue  wearing graduated compression stockings class 1 on a daily basis, beginning first thing in the morning and removing them in the evening.  The patient is CEAP class 3.  His job requirements of extended periods of driving and sitting are extremely difficult and really not feasible for him at this time.  The patient has been wearing compression for more than 12 weeks with no or little benefit.  The patient has been exercising daily for more than 12 weeks. The patient has been elevating and taking OTC pain medications for more than 12 weeks.  None of these have have eliminated the pain related to the varicose veins and venous reflux or the discomfort regarding venous congestion.    In addition, behavioral modification including elevation during the day was again discussed and this will continue.  The patient has utilized over the counter pain medications and has been exercising.  However, at this time conservative therapy has not alleviated the patient's symptoms of leg pain and swelling  Recommend: laser ablation of the right and  left great saphenous veins to eliminate the symptoms of pain and swelling of the lower extremities caused by the severe superficial venous reflux disease.

## 2022-12-10 NOTE — Progress Notes (Signed)
MRN : [id number]  Brandon Barr is a 47 y.o. (27-Feb-1976) male who presents with chief complaint of  Chief Complaint  Patient presents with   Follow-up    3 month follow up  .  History of Present Illness: Patient returns today in follow up of his venous insufficiency.  He has been wearing his 20 to 30 mmHg compression socks without much improvement in his lower extremity pain and swelling.  He denies any chest pain or shortness of breath.  He has been elevating his legs.  He still requires anti-inflammatories for discomfort.  His job has requested a lot of long drives which has become very difficult for him due to the pain in his legs with his venous insufficiency.  Sitting or standing for long periods of time is extremely difficult task for him.  He previously performed venous duplex showed bilateral great saphenous vein reflux without DVT or superficial thrombophlebitis.  Current Outpatient Medications  Medication Sig Dispense Refill   albuterol (VENTOLIN HFA) 108 (90 Base) MCG/ACT inhaler Inhale 1-2 puffs into the lungs every 6 (six) hours as needed for shortness of breath. 1 each 0   amphetamine-dextroamphetamine (ADDERALL) 30 MG tablet Take 1 tablet by mouth 2 (two) times daily. 60 tablet 0   amphetamine-dextroamphetamine (ADDERALL) 30 MG tablet Take 1 tablet by mouth 2 (two) times daily. 60 tablet 0   amphetamine-dextroamphetamine (ADDERALL) 30 MG tablet Take 1 tablet by mouth 2 (two) times daily. Fill on/after 10/09/22 60 tablet 0   cyanocobalamin (VITAMIN B12) 1000 MCG/ML injection 1000 mcg injected monthly. 3 mL 3   EPINEPHrine (EPIPEN 2-PAK) 0.3 mg/0.3 mL IJ SOAJ injection Inject 0.3 mg into the muscle as needed for anaphylaxis.     Multiple Vitamin (MULTIVITAMIN) tablet Take 1 tablet by mouth daily.     Nattokinase 100 MG CAPS Take 200 mg by mouth.     SYRINGE-NEEDLE, DISP, 3 ML (BD INTEGRA SYRINGE) 25G X 1" 3 ML MISC Use for B12 injection. 10 each 1   VITAMIN D PO Take  by mouth.     No current facility-administered medications for this visit.    Past Medical History:  Diagnosis Date   ADHD (attention deficit hyperactivity disorder)    Asthma    "laughing asthma"   Celiac disease    GERD (gastroesophageal reflux disease)    Meningitis, viral    2004    Past Surgical History:  Procedure Laterality Date   MENISECTOMY     left knee and plica removed   TONSILLECTOMY Left 09/12/2021   Procedure: TONSILLECTOMY;  Surgeon: Carloyn Manner, MD;  Location: [facility];  Service: ENT;  Laterality: Left;     Social History   Tobacco Use   Smoking status: Former    Types: Cigarettes    Quit date: 2006    Years since quitting: 18.0   Smokeless tobacco: Former    Types: Chew    Quit date: 2002   Tobacco comments:    quit 2006  Vaping Use   Vaping Use: Never used  Substance Use Topics   Alcohol use: Yes    Comment: very rarely   Drug use: No       Family History  Problem Relation Age of Onset   ADD / ADHD Mother    Celiac disease Mother    Heart attack Maternal Grandmother      Allergies  Allergen Reactions   Shellfish Allergy Anaphylaxis   Gluten Meal  Has celiac disease    REVIEW OF SYSTEMS (Negative unless checked)   Constitutional: [] Weight loss  [] Fever  [] Chills Cardiac: [] Chest pain   [] Chest pressure   [] Palpitations   [] Shortness of breath when laying flat   [] Shortness of breath at rest   [] Shortness of breath with exertion. Vascular:  [] Pain in legs with walking   [] Pain in legs at rest   [] Pain in legs when laying flat   [] Claudication   [] Pain in feet when walking  [] Pain in feet at rest  [] Pain in feet when laying flat   [] History of DVT   [] Phlebitis   [x] Swelling in legs   [x] Varicose veins   [] Non-healing ulcers Pulmonary:   [] Uses home oxygen   [] Productive cough   [] Hemoptysis   [] Wheeze  [] COPD   [x] Asthma Neurologic:  [] Dizziness  [] Blackouts   [] Seizures   [] History of stroke   [] History of TIA   [] Aphasia   [] Temporary blindness   [] Dysphagia   [] Weakness or numbness in arms   [] Weakness or numbness in legs Musculoskeletal:  [] Arthritis   [] Joint swelling   [] Joint pain   [] Low back pain Hematologic:  [] Easy bruising  [] Easy bleeding   [] Hypercoagulable state   [] Anemic  [] Hepatitis Gastrointestinal:  [] Blood in stool   [] Vomiting blood  [x] Gastroesophageal reflux/heartburn   [] Abdominal pain Genitourinary:  [] Chronic kidney disease   [] Difficult urination  [] Frequent urination  [] Burning with urination   [] Hematuria Skin:  [] Rashes   [] Ulcers   [] Wounds Psychological:  [] History of anxiety   []  History of major depression.  Physical Examination  BP 136/86 (BP Location: Right Arm)   Pulse 74   Resp 16   Wt 243 lb (110.2 kg)   BMI 32.06 kg/m  Gen:  WD/WN, NAD Head: [place]/AT, No temporalis wasting. Ear/Nose/Throat: Hearing grossly intact, nares w/o erythema or drainage Eyes: Conjunctiva clear. Sclera non-icteric Neck: Supple.  Trachea midline Pulmonary:  Good air movement, no use of accessory muscles.  Cardiac: RRR, no JVD Vascular:  Vessel Right Left  Radial Palpable Palpable                          PT Palpable Palpable  DP Palpable Palpable   Gastrointestinal: soft, non-tender/non-distended. No guarding/reflex.  Musculoskeletal: M/S 5/5 throughout.  No deformity or atrophy.  Diffuse varicosities bilaterally.  No appreciable edema present this morning. Neurologic: Sensation grossly intact in extremities.  Symmetrical.  Speech is fluent.  Psychiatric: Judgment intact, Mood & affect appropriate for pt's clinical situation. Dermatologic: No rashes or ulcers noted.  No cellulitis or open wounds.      Labs Recent Results (from the past 2160 hour(s))  Vitamin B12     Status: None   Collection Time: 10/03/22  8:27 AM  Result Value Ref Range   Vitamin B-12 299 211 - 911 pg/mL    Radiology No results found.  Assessment/Plan Venous stasis dermatitis of both lower  extremities More noticeable over the last few years  Varicose veins of leg with pain, bilateral Recommend  I have reviewed my previous  discussion with the patient regarding  varicose veins and why they cause symptoms. Patient will continue  wearing graduated compression stockings class 1 on a daily basis, beginning first thing in the morning and removing them in the evening.  The patient is CEAP class 3.  His job requirements of extended periods of driving and sitting are extremely difficult and really not feasible for him at  this time.  The patient has been wearing compression for more than 12 weeks with no or little benefit.  The patient has been exercising daily for more than 12 weeks. The patient has been elevating and taking OTC pain medications for more than 12 weeks.  None of these have have eliminated the pain related to the varicose veins and venous reflux or the discomfort regarding venous congestion.    In addition, behavioral modification including elevation during the day was again discussed and this will continue.  The patient has utilized over the counter pain medications and has been exercising.  However, at this time conservative therapy has not alleviated the patient's symptoms of leg pain and swelling  Recommend: laser ablation of the right and  left great saphenous veins to eliminate the symptoms of pain and swelling of the lower extremities caused by the severe superficial venous reflux disease.     Leotis Pain, MD  12/10/2022 9:59 AM    This note was created with Dragon medical transcription system.  Any errors from dictation are purely unintentional

## 2023-01-21 ENCOUNTER — Other Ambulatory Visit: Payer: Self-pay | Admitting: Family Medicine

## 2023-01-22 MED ORDER — AMPHETAMINE-DEXTROAMPHETAMINE 30 MG PO TABS: 30.0000 mg | ORAL_TABLET | Freq: Two times a day (BID) | ORAL | 0 refills | Status: DC

## 2023-01-22 NOTE — Telephone Encounter (Signed)
Refill request for   amphetamine-dextroamphetamine (ADDERALL) 30 MG tablet    LOV - 07/01/22 Next OV - not scheduled Last refill - 09/24/22 #60/0 x3

## 2023-04-21 ENCOUNTER — Other Ambulatory Visit: Payer: Self-pay | Admitting: Family Medicine

## 2023-04-21 DIAGNOSIS — J209 Acute bronchitis, unspecified: Secondary | ICD-10-CM

## 2023-04-21 DIAGNOSIS — J0140 Acute pansinusitis, unspecified: Secondary | ICD-10-CM

## 2023-04-21 NOTE — Telephone Encounter (Signed)
Patient is overdue for CPE; please call to schedule appt. 

## 2023-04-21 NOTE — Telephone Encounter (Signed)
Refill request for   amphetamine-dextroamphetamine (ADDERALL) 30 MG tablet    LOV - 07/01/22 Next OV - not scheduled Last refill - 01/22/23 #60/0 x 3

## 2023-04-21 NOTE — Telephone Encounter (Signed)
Patient scheduled for CPE and labs.  

## 2023-04-22 MED ORDER — AMPHETAMINE-DEXTROAMPHETAMINE 30 MG PO TABS: 30.0000 mg | ORAL_TABLET | Freq: Two times a day (BID) | ORAL | 0 refills | Status: DC

## 2023-04-22 MED ORDER — ALBUTEROL SULFATE HFA 108 (90 BASE) MCG/ACT IN AERS: 1.0000 | INHALATION_SPRAY | Freq: Four times a day (QID) | RESPIRATORY_TRACT | 0 refills | Status: DC | PRN

## 2023-05-06 ENCOUNTER — Other Ambulatory Visit: Payer: Self-pay | Admitting: Family Medicine

## 2023-05-06 DIAGNOSIS — E538 Deficiency of other specified B group vitamins: Secondary | ICD-10-CM

## 2023-05-16 ENCOUNTER — Other Ambulatory Visit (INDEPENDENT_AMBULATORY_CARE_PROVIDER_SITE_OTHER): Payer: 59

## 2023-05-16 DIAGNOSIS — E538 Deficiency of other specified B group vitamins: Secondary | ICD-10-CM | POA: Diagnosis not present

## 2023-05-16 LAB — CBC WITH DIFFERENTIAL/PLATELET
Basophils Absolute: 0 10*3/uL (ref 0.0–0.1)
Basophils Relative: 0.4 % (ref 0.0–3.0)
Eosinophils Absolute: 0.1 10*3/uL (ref 0.0–0.7)
Eosinophils Relative: 1.3 % (ref 0.0–5.0)
HCT: 50.6 % (ref 39.0–52.0)
Hemoglobin: 17.3 g/dL — ABNORMAL HIGH (ref 13.0–17.0)
Lymphocytes Relative: 37.9 % (ref 12.0–46.0)
Lymphs Abs: 2.3 10*3/uL (ref 0.7–4.0)
MCHC: 34.3 g/dL (ref 30.0–36.0)
MCV: 88.5 fl (ref 78.0–100.0)
Monocytes Absolute: 0.5 10*3/uL (ref 0.1–1.0)
Monocytes Relative: 8 % (ref 3.0–12.0)
Neutro Abs: 3.2 10*3/uL (ref 1.4–7.7)
Neutrophils Relative %: 52.4 % (ref 43.0–77.0)
Platelets: 195 10*3/uL (ref 150.0–400.0)
RBC: 5.71 Mil/uL (ref 4.22–5.81)
RDW: 13.3 % (ref 11.5–15.5)
WBC: 6.2 10*3/uL (ref 4.0–10.5)

## 2023-05-16 LAB — BASIC METABOLIC PANEL
BUN: 14 mg/dL (ref 6–23)
CO2: 30 mEq/L (ref 19–32)
Calcium: 9.3 mg/dL (ref 8.4–10.5)
Chloride: 101 mEq/L (ref 96–112)
Creatinine, Ser: 1.25 mg/dL (ref 0.40–1.50)
GFR: 69.43 mL/min (ref >60.00)
Glucose, Bld: 89 mg/dL (ref 70–99)
Potassium: 4.3 mEq/L (ref 3.5–5.1)
Sodium: 138 mEq/L (ref 135–145)

## 2023-05-16 LAB — VITAMIN B12: Vitamin B-12: 309 pg/mL (ref 211–911)

## 2023-05-23 ENCOUNTER — Ambulatory Visit (INDEPENDENT_AMBULATORY_CARE_PROVIDER_SITE_OTHER): Payer: 59 | Admitting: Family Medicine

## 2023-05-23 ENCOUNTER — Encounter: Payer: Self-pay | Admitting: Family Medicine

## 2023-05-23 VITALS — BP 120/82 | HR 87 | Temp 97.3°F | Ht 73.0 in | Wt 247.0 lb

## 2023-05-23 DIAGNOSIS — F988 Other specified behavioral and emotional disorders with onset usually occurring in childhood and adolescence: Secondary | ICD-10-CM

## 2023-05-23 DIAGNOSIS — Z23 Encounter for immunization: Secondary | ICD-10-CM | POA: Diagnosis not present

## 2023-05-23 DIAGNOSIS — E538 Deficiency of other specified B group vitamins: Secondary | ICD-10-CM

## 2023-05-23 DIAGNOSIS — Z1211 Encounter for screening for malignant neoplasm of colon: Secondary | ICD-10-CM

## 2023-05-23 DIAGNOSIS — Z7189 Other specified counseling: Secondary | ICD-10-CM

## 2023-05-23 DIAGNOSIS — Z0000 Encounter for general adult medical examination without abnormal findings: Secondary | ICD-10-CM | POA: Diagnosis not present

## 2023-05-23 DIAGNOSIS — R6882 Decreased libido: Secondary | ICD-10-CM

## 2023-05-23 MED ORDER — EPINEPHRINE 0.3 MG/0.3ML IJ SOAJ: 0.3000 mg | INTRAMUSCULAR | 1 refills | Status: DC | PRN

## 2023-05-23 NOTE — Progress Notes (Unsigned)
CPE- See plan.  Routine anticipatory guidance given to patient.  See health maintenance.  The possibility exists that previously documented standard health maintenance information may have been brought forward from a previous encounter into this note.  If needed, that same information has been updated to reflect the current situation based on today's encounter.    Tetanus today.   Flu d/w pt.   PNA and shingles not due.  Covid prev done.  PSA not due.  D/w patient [id number] for colon cancer screening, including IFOB vs. colonoscopy.  Risks and benefits of both were discussed and patient voiced understanding.  Pt elects for colonoscopy.   Living will d/w pt.  Wife designated if patient were incapacitated.   Diet and exercise d/w pt.    ADD.  Still on adderall at baseline.  Compliant. It helps.  Busy with work.    Lower libido and ED noted.  D/w pt about options.    B12.  Had 3 doses in the last 6 months.  He feels a change at the end of the month, when due.  D/w pt about restart monthly injection. Injections done at home.    PMH and SH reviewed  Meds, vitals, and allergies reviewed.   ROS: Per HPI.  Unless specifically indicated otherwise in HPI, the patient denies:  General: fever. Eyes: acute vision changes ENT: sore throat Cardiovascular: chest pain Respiratory: SOB GI: vomiting GU: dysuria Musculoskeletal: acute back pain Derm: acute rash Neuro: acute motor dysfunction Psych: worsening mood Endocrine: polydipsia Heme: bleeding Allergy: hayfever  GEN: nad, alert and oriented HEENT: mucous membranes moist NECK: supple w/o LA CV: rrr. PULM: ctab, no inc wob ABD: soft, +bs EXT: no edema SKIN: no acute rash

## 2023-05-23 NOTE — Patient Instructions (Addendum)
Ask for a early morning lab visit.  You don't have to fast.  Take care.  Glad to see you.

## 2023-05-25 DIAGNOSIS — Z7189 Other specified counseling: Secondary | ICD-10-CM | POA: Insufficient documentation

## 2023-05-25 DIAGNOSIS — Z0000 Encounter for general adult medical examination without abnormal findings: Secondary | ICD-10-CM | POA: Insufficient documentation

## 2023-05-25 DIAGNOSIS — R6882 Decreased libido: Secondary | ICD-10-CM | POA: Insufficient documentation

## 2023-05-25 NOTE — Assessment & Plan Note (Signed)
Could be multifactorial.  Work stressors discussed.  Discussed diet and exercise.  Return for follow-up labs.

## 2023-05-25 NOTE — Assessment & Plan Note (Signed)
Living will d/w pt.  Wife designated if patient were incapacitated.   ?

## 2023-05-25 NOTE — Assessment & Plan Note (Signed)
Still on adderall at baseline.  Compliant. It helps.  Busy with work.   Would continue Adderall as is.

## 2023-05-25 NOTE — Assessment & Plan Note (Signed)
Tetanus 2024.   Flu d/w pt.   PNA and shingles not due.  Covid prev done.  PSA not due.  D/w patient [id number] for colon cancer screening, including IFOB vs. colonoscopy.  Risks and benefits of both were discussed and patient voiced understanding.  Pt elects for colonoscopy.   Living will d/w pt.  Wife designated if patient were incapacitated.   Diet and exercise d/w pt.

## 2023-05-25 NOTE — Assessment & Plan Note (Signed)
Had 3 doses in the last 6 months.  He feels a change at the end of the month, when due.  D/w pt about restart monthly injection. Injections done at home.

## 2023-05-27 ENCOUNTER — Other Ambulatory Visit: Payer: Self-pay

## 2023-05-27 ENCOUNTER — Telehealth: Payer: Self-pay

## 2023-05-27 DIAGNOSIS — Z1211 Encounter for screening for malignant neoplasm of colon: Secondary | ICD-10-CM

## 2023-05-27 MED ORDER — NA SULFATE-K SULFATE-MG SULF 17.5-3.13-1.6 GM/177ML PO SOLN: 1.0000 | Freq: Once | ORAL | 0 refills | Status: AC

## 2023-05-27 NOTE — Telephone Encounter (Signed)
Gastroenterology Pre-Procedure Review  Request Date: 06/18/23 Requesting Physician: Dr. Servando Snare  PATIENT REVIEW QUESTIONS: The patient responded to the following health history questions as indicated:    1. Are you having any GI issues? no 2. Do you have a personal history of Polyps? no 3. Do you have a family history of Colon Cancer or Polyps? no 4. Diabetes Mellitus? no 5. Joint replacements in the past 12 months?no 6. Major health problems in the past 3 months?no 7. Any artificial heart valves, MVP, or defibrillator?no    MEDICATIONS & ALLERGIES:    Patient reports the following regarding taking any anticoagulation/antiplatelet therapy:   Plavix, Coumadin, Eliquis, Xarelto, Lovenox, Pradaxa, Brilinta, or Effient? no Aspirin? no  Patient confirms/reports the following medications:  Current Outpatient Medications  Medication Sig Dispense Refill   albuterol (VENTOLIN HFA) 108 (90 Base) MCG/ACT inhaler Inhale 1-2 puffs into the lungs every 6 (six) hours as needed for shortness of breath. 1 each 0   amphetamine-dextroamphetamine (ADDERALL) 30 MG tablet Take 1 tablet by mouth 2 (two) times daily. 60 tablet 0   cyanocobalamin (VITAMIN B12) 1000 MCG/ML injection 1000 mcg injected monthly. 3 mL 3   EPINEPHrine (EPIPEN 2-PAK) 0.3 mg/0.3 mL IJ SOAJ injection Inject 0.3 mg into the muscle as needed for anaphylaxis. 2 each 1   Multiple Vitamin (MULTIVITAMIN) tablet Take 1 tablet by mouth daily.     Nattokinase 100 MG CAPS Take 200 mg by mouth.     SYRINGE-NEEDLE, DISP, 3 ML (BD INTEGRA SYRINGE) 25G X 1" 3 ML MISC Use for B12 injection. 10 each 1   VITAMIN D PO Take by mouth.     No current facility-administered medications for this visit.    Patient confirms/reports the following allergies:  Allergies  Allergen Reactions   Shellfish Allergy Anaphylaxis   Gluten Meal     Has celiac disease    No orders of the defined types were placed in this encounter.   AUTHORIZATION  INFORMATION Primary Insurance: 1D#: Group #:  Secondary Insurance: 1D#: Group #:  SCHEDULE INFORMATION: Date: 06/18/23 Time: Location: [facility]

## 2023-05-28 ENCOUNTER — Other Ambulatory Visit (INDEPENDENT_AMBULATORY_CARE_PROVIDER_SITE_OTHER): Payer: 59

## 2023-05-28 DIAGNOSIS — R6882 Decreased libido: Secondary | ICD-10-CM

## 2023-05-28 LAB — TESTOSTERONE: Testosterone: 381.47 ng/dL (ref 300.00–890.00)

## 2023-05-30 ENCOUNTER — Encounter: Payer: Self-pay | Admitting: Family Medicine

## 2023-06-01 ENCOUNTER — Other Ambulatory Visit: Payer: Self-pay | Admitting: Family Medicine

## 2023-06-01 MED ORDER — SILDENAFIL CITRATE 20 MG PO TABS: 20.0000 mg | ORAL_TABLET | Freq: Every day | ORAL | 12 refills | Status: DC | PRN

## 2023-06-10 NOTE — Progress Notes (Signed)
Pt called for pre call for Colonoscopy next week. Pt stated that he has to reschedule procedure because he has another procedure scheduled for next week. Pt instructed to call GI office to reschedule. Pt verbalized understanding.

## 2023-06-11 ENCOUNTER — Telehealth: Payer: Self-pay

## 2023-06-11 NOTE — Telephone Encounter (Signed)
Spoken to patient and he need to cancel his colonoscopy due to work. Unable to reschedule at this time.

## 2023-06-11 NOTE — Telephone Encounter (Signed)
Pt left vmm to cancel procedure on 06/18/2023 please return call

## 2023-06-16 NOTE — Telephone Encounter (Signed)
Spoken to Trish at endo unit to make the change this morning.

## 2023-06-18 ENCOUNTER — Encounter: Admission: RE | Payer: Self-pay | Source: Ambulatory Visit

## 2023-06-18 ENCOUNTER — Ambulatory Visit: Admission: RE | Admit: 2023-06-18 | Payer: 59 | Source: Ambulatory Visit | Admitting: Gastroenterology

## 2023-06-18 SURGERY — COLONOSCOPY WITH PROPOFOL
Anesthesia: General

## 2023-06-20 ENCOUNTER — Other Ambulatory Visit (INDEPENDENT_AMBULATORY_CARE_PROVIDER_SITE_OTHER): Payer: 59 | Admitting: Vascular Surgery

## 2023-06-27 ENCOUNTER — Encounter (INDEPENDENT_AMBULATORY_CARE_PROVIDER_SITE_OTHER): Payer: 59

## 2023-09-18 ENCOUNTER — Other Ambulatory Visit: Payer: Self-pay | Admitting: Family Medicine

## 2023-09-18 MED ORDER — AMPHETAMINE-DEXTROAMPHETAMINE 30 MG PO TABS: 30.0000 mg | ORAL_TABLET | Freq: Two times a day (BID) | ORAL | 0 refills | Status: DC

## 2023-09-18 NOTE — Telephone Encounter (Signed)
Last Refill: 04/28/23 #60 w/ 0 refills  LOV: 05/23/23  NOV: Not scheduled

## 2023-09-18 NOTE — Telephone Encounter (Signed)
Sent. Thanks.   

## 2024-01-28 ENCOUNTER — Other Ambulatory Visit: Payer: Self-pay | Admitting: Family Medicine

## 2024-01-28 DIAGNOSIS — J209 Acute bronchitis, unspecified: Secondary | ICD-10-CM

## 2024-01-28 DIAGNOSIS — J0140 Acute pansinusitis, unspecified: Secondary | ICD-10-CM

## 2024-01-29 MED ORDER — AMPHETAMINE-DEXTROAMPHETAMINE 30 MG PO TABS: 30.0000 mg | ORAL_TABLET | Freq: Two times a day (BID) | ORAL | 0 refills | Status: DC

## 2024-01-29 MED ORDER — ALBUTEROL SULFATE HFA 108 (90 BASE) MCG/ACT IN AERS
1.0000 | INHALATION_SPRAY | Freq: Four times a day (QID) | RESPIRATORY_TRACT | 1 refills | Status: AC | PRN
Start: 2024-01-29 — End: ?

## 2024-01-29 NOTE — Telephone Encounter (Signed)
Last refill: This request was routed electronically. amphetamine-dextroamphetamine (ADDERALL) 30 MG tablet 09/18/23 60 tablets 0 refills  Last office visit:05/23/23 Next office visit: nothing scheduled

## 2024-04-27 ENCOUNTER — Encounter (INDEPENDENT_AMBULATORY_CARE_PROVIDER_SITE_OTHER): Payer: Self-pay

## 2024-06-03 ENCOUNTER — Encounter: Payer: Self-pay | Admitting: Family Medicine

## 2024-06-03 ENCOUNTER — Ambulatory Visit (INDEPENDENT_AMBULATORY_CARE_PROVIDER_SITE_OTHER): Admitting: Family Medicine

## 2024-06-03 ENCOUNTER — Other Ambulatory Visit (HOSPITAL_COMMUNITY): Payer: Self-pay

## 2024-06-03 ENCOUNTER — Telehealth: Payer: Self-pay

## 2024-06-03 VITALS — BP 118/74 | HR 82 | Temp 98.1°F | Ht 73.0 in | Wt 255.4 lb

## 2024-06-03 DIAGNOSIS — E538 Deficiency of other specified B group vitamins: Secondary | ICD-10-CM

## 2024-06-03 DIAGNOSIS — Z7189 Other specified counseling: Secondary | ICD-10-CM

## 2024-06-03 DIAGNOSIS — Z0000 Encounter for general adult medical examination without abnormal findings: Secondary | ICD-10-CM | POA: Diagnosis not present

## 2024-06-03 DIAGNOSIS — F988 Other specified behavioral and emotional disorders with onset usually occurring in childhood and adolescence: Secondary | ICD-10-CM

## 2024-06-03 DIAGNOSIS — E785 Hyperlipidemia, unspecified: Secondary | ICD-10-CM

## 2024-06-03 DIAGNOSIS — N529 Male erectile dysfunction, unspecified: Secondary | ICD-10-CM

## 2024-06-03 LAB — CBC WITH DIFFERENTIAL/PLATELET
Basophils Absolute: 0 10*3/uL (ref 0.0–0.1)
Basophils Relative: 0.4 % (ref 0.0–3.0)
Eosinophils Absolute: 0 10*3/uL (ref 0.0–0.7)
Eosinophils Relative: 0.8 % (ref 0.0–5.0)
HCT: 48 % (ref 39.0–52.0)
Hemoglobin: 16.4 g/dL (ref 13.0–17.0)
Lymphocytes Relative: 39.4 % (ref 12.0–46.0)
Lymphs Abs: 2.2 10*3/uL (ref 0.7–4.0)
MCHC: 34.3 g/dL (ref 30.0–36.0)
MCV: 86.9 fl (ref 78.0–100.0)
Monocytes Absolute: 0.4 10*3/uL (ref 0.1–1.0)
Monocytes Relative: 7.5 % (ref 3.0–12.0)
Neutro Abs: 2.9 10*3/uL (ref 1.4–7.7)
Neutrophils Relative %: 51.9 % (ref 43.0–77.0)
Platelets: 204 10*3/uL (ref 150.0–400.0)
RBC: 5.52 Mil/uL (ref 4.22–5.81)
RDW: 13.4 % (ref 11.5–15.5)
WBC: 5.7 10*3/uL (ref 4.0–10.5)

## 2024-06-03 LAB — COMPREHENSIVE METABOLIC PANEL WITH GFR
ALT: 34 U/L (ref 0–53)
AST: 21 U/L (ref 0–37)
Albumin: 4.8 g/dL (ref 3.5–5.2)
Alkaline Phosphatase: 65 U/L (ref 39–117)
BUN: 11 mg/dL (ref 6–23)
CO2: 31 meq/L (ref 19–32)
Calcium: 9.5 mg/dL (ref 8.4–10.5)
Chloride: 102 meq/L (ref 96–112)
Creatinine, Ser: 1.24 mg/dL (ref 0.40–1.50)
GFR: 69.59 mL/min (ref >60.00)
Glucose, Bld: 93 mg/dL (ref 70–99)
Potassium: 4.9 meq/L (ref 3.5–5.1)
Sodium: 139 meq/L (ref 135–145)
Total Bilirubin: 1.5 mg/dL — ABNORMAL HIGH (ref 0.2–1.2)
Total Protein: 6.9 g/dL (ref 6.0–8.3)

## 2024-06-03 LAB — LIPID PANEL
Cholesterol: 164 mg/dL (ref 0–200)
HDL: 39 mg/dL — ABNORMAL LOW (ref >39.00)
LDL Cholesterol: 108 mg/dL — ABNORMAL HIGH (ref 0–99)
NonHDL: 125.13
Total CHOL/HDL Ratio: 4
Triglycerides: 86 mg/dL (ref 0.0–149.0)
VLDL: 17.2 mg/dL (ref 0.0–40.0)

## 2024-06-03 LAB — VITAMIN B12: Vitamin B-12: 344 pg/mL (ref 211–911)

## 2024-06-03 MED ORDER — AMPHETAMINE-DEXTROAMPHETAMINE 30 MG PO TABS: 30.0000 mg | ORAL_TABLET | Freq: Two times a day (BID) | ORAL | 0 refills | Status: DC

## 2024-06-03 MED ORDER — CYANOCOBALAMIN 1000 MCG/ML IJ SOLN
INTRAMUSCULAR | 3 refills | Status: AC
Start: 2024-06-03 — End: ?

## 2024-06-03 MED ORDER — EPINEPHRINE 0.3 MG/0.3ML IJ SOAJ: 0.3000 mg | INTRAMUSCULAR | 1 refills | Status: AC | PRN

## 2024-06-03 MED ORDER — SILDENAFIL CITRATE 20 MG PO TABS: 20.0000 mg | ORAL_TABLET | Freq: Every day | ORAL | 12 refills | Status: DC | PRN

## 2024-06-03 MED ORDER — BD INTEGRA SYRINGE 25G X 1" 3 ML MISC: 1 refills | Status: AC

## 2024-06-03 NOTE — Patient Instructions (Addendum)
 Check with insurance about cologuard cost.  Let me know if you want me to send that it.  Or we can refer to GI if needed.  Either way, let me know what you want to do.  Take care.  Glad to see you. Go to the lab on the way out.   If you have mychart we'll likely use that to update you.    Update me as needed.

## 2024-06-03 NOTE — Telephone Encounter (Signed)
 Please notify pt that PA was approved. Thanks.

## 2024-06-03 NOTE — Telephone Encounter (Signed)
 Pharmacy Patient Advocate Encounter   Received notification from Onbase that prior authorization for Amphetamine -Dextroamphetamine  30MG  tablets is required/requested.   Insurance verification completed.   The patient is insured through [employer] .   Per test claim: PA required and submitted KEY/EOC/Request #: [id number] from 05/04/24 to 06/03/25. Ran test claim, Copay is $12. This test claim was processed through [facility]- copay amounts may vary at other pharmacies due to pharmacy/plan contracts, or as the patient moves through the different stages of their insurance plan.

## 2024-06-03 NOTE — Progress Notes (Signed)
 CPE- See plan.  Routine anticipatory guidance given to patient.  See health maintenance.  The possibility exists that previously documented standard health maintenance information may have been brought forward from a previous encounter into this note.  If needed, that same information has been updated to reflect the current situation based on today's encounter.    Tetanus 2024.   Flu d/w pt.   PNA and shingles not due.  Covid prev done.  PSA not due.  D/w patient [id number] for colon cancer screening, including IFOB vs. colonoscopy.  Risks and benefits of both were discussed and patient voiced understanding.  He had to cancel colonoscopy last year.  D/w pt about cologuard options.  See AVS.   Living will d/w pt.  Wife designated if patient were incapacitated.   Diet and exercise d/w pt.    ADD.  He tried tapering his dose.  He is going to have to move to [place] for work, d/w pt.  He is resuming prev done.  Med helped.  He can tolerate med.    He had been off B12 for about 3 months, d/w pt about restart, labs pending.   H/o mild LDL elevation, labs pending.  See notes on labs.  Had epipen , d/w pt.  Routine cautions d/w pt.    ED d/w pt.  Stressors d/w pt.  Sildenafil  didn't help at low dose, d/w p about using up to 100mg .    PMH and SH reviewed  Meds, vitals, and allergies reviewed.   ROS: Per HPI.  Unless specifically indicated otherwise in HPI, the patient denies:  General: fever. Eyes: acute vision changes ENT: sore throat Cardiovascular: chest pain Respiratory: SOB GI: vomiting GU: dysuria Musculoskeletal: acute back pain Derm: acute rash Neuro: acute motor dysfunction Psych: worsening mood Endocrine: polydipsia Heme: bleeding Allergy: hayfever  GEN: nad, alert and oriented HEENT: mucous membranes moist NECK: supple w/o LA CV: rrr. PULM: ctab, no inc wob ABD: soft, +bs EXT: no edema SKIN: Well-perfused.

## 2024-06-04 NOTE — Telephone Encounter (Signed)
 Last read by Evalene Georgette Brett Georgette at 9:45AM on 06/04/2024.

## 2024-06-06 ENCOUNTER — Ambulatory Visit: Payer: Self-pay | Admitting: Family Medicine

## 2024-06-06 NOTE — Assessment & Plan Note (Signed)
 Tetanus 2024.   Flu d/w pt.   PNA and shingles not due.  Covid prev done.  PSA not due.  D/w patient mz:[id number] for colon cancer screening, including IFOB vs. colonoscopy.  Risks and benefits of both were discussed and patient voiced understanding.  He had to cancel colonoscopy last year.  D/w pt about cologuard options.  See AVS.   Living will d/w pt.  Wife designated if patient were incapacitated.   Diet and exercise d/w pt.

## 2024-06-06 NOTE — Assessment & Plan Note (Signed)
 Sildenafil  didn't help at low dose, d/w p about using up to 100mg .  He can update me as needed.

## 2024-06-06 NOTE — Assessment & Plan Note (Signed)
 Continue Adderall as is.  Update me as needed.

## 2024-06-06 NOTE — Assessment & Plan Note (Signed)
 He had been off B12 for about 3 months, d/w pt about restart, labs pending.

## 2024-06-06 NOTE — Assessment & Plan Note (Signed)
 Living will d/w pt.  Wife designated if patient were incapacitated.   ?

## 2024-10-04 ENCOUNTER — Encounter: Payer: Self-pay | Admitting: Family Medicine

## 2024-10-07 MED ORDER — TADALAFIL 5 MG PO TABS: 5.0000 mg | ORAL_TABLET | Freq: Every day | ORAL | 11 refills | Status: DC

## 2024-11-18 ENCOUNTER — Other Ambulatory Visit: Payer: Self-pay

## 2024-11-18 ENCOUNTER — Emergency Department (HOSPITAL_COMMUNITY)

## 2024-11-18 ENCOUNTER — Encounter (HOSPITAL_COMMUNITY): Payer: Self-pay

## 2024-11-18 ENCOUNTER — Emergency Department (HOSPITAL_COMMUNITY)
Admission: EM | Admit: 2024-11-18 | Discharge: 2024-11-18 | Disposition: A | Discharge status: Home / Self Care | Attending: Emergency Medicine | Admitting: Emergency Medicine

## 2024-11-18 DIAGNOSIS — J45909 Unspecified asthma, uncomplicated: Secondary | ICD-10-CM | POA: Diagnosis not present

## 2024-11-18 DIAGNOSIS — Z79899 Other long term (current) drug therapy: Secondary | ICD-10-CM | POA: Diagnosis not present

## 2024-11-18 DIAGNOSIS — R1084 Generalized abdominal pain: Secondary | ICD-10-CM | POA: Insufficient documentation

## 2024-11-18 LAB — CBC
HCT: 53.7 % — ABNORMAL HIGH (ref 39.0–52.0)
Hemoglobin: 18.5 g/dL — ABNORMAL HIGH (ref 13.0–17.0)
MCH: 29.8 pg (ref 26.0–34.0)
MCHC: 34.5 g/dL (ref 30.0–36.0)
MCV: 86.5 fL (ref 80.0–100.0)
Platelets: 222 K/uL (ref 150–400)
RBC: 6.21 MIL/uL — ABNORMAL HIGH (ref 4.22–5.81)
RDW: 12.6 % (ref 11.5–15.5)
WBC: 10 K/uL (ref 4.0–10.5)
nRBC: 0 % (ref 0.0–0.2)

## 2024-11-18 LAB — COMPREHENSIVE METABOLIC PANEL WITH GFR
ALT: 20 U/L (ref 0–44)
AST: 19 U/L (ref 15–41)
Albumin: 4.5 g/dL (ref 3.5–5.0)
Alkaline Phosphatase: 93 U/L (ref 38–126)
Anion gap: 10 (ref 5–15)
BUN: 9 mg/dL (ref 6–20)
CO2: 26 mmol/L (ref 22–32)
Calcium: 9.5 mg/dL (ref 8.9–10.3)
Chloride: 100 mmol/L (ref 98–111)
Creatinine, Ser: 1.16 mg/dL (ref 0.61–1.24)
GFR, Estimated: >60 mL/min (ref >60)
Glucose, Bld: 125 mg/dL — ABNORMAL HIGH (ref 70–99)
Potassium: 4.5 mmol/L (ref 3.5–5.1)
Sodium: 136 mmol/L (ref 135–145)
Total Bilirubin: 1.5 mg/dL — ABNORMAL HIGH (ref 0.0–1.2)
Total Protein: 7 g/dL (ref 6.5–8.1)

## 2024-11-18 LAB — URINALYSIS, ROUTINE W REFLEX MICROSCOPIC
Bilirubin Urine: NEGATIVE
Glucose, UA: NEGATIVE mg/dL
Hgb urine dipstick: NEGATIVE
Ketones, ur: NEGATIVE mg/dL
Leukocytes,Ua: NEGATIVE
Nitrite: NEGATIVE
Protein, ur: NEGATIVE mg/dL
Specific Gravity, Urine: 1.016 (ref 1.005–1.030)
pH: 5 (ref 5.0–8.0)

## 2024-11-18 LAB — LIPASE, BLOOD: Lipase: 26 U/L (ref 11–51)

## 2024-11-18 MED ORDER — ALUM & MAG HYDROXIDE-SIMETH 200-200-20 MG/5ML PO SUSP
30.0000 mL | Freq: Once | ORAL | Status: AC
  Administered 2024-11-18: 30 mL via ORAL
  Filled 2024-11-18: qty 30

## 2024-11-18 MED ORDER — POLYETHYLENE GLYCOL 3350 17 G PO PACK: 17.0000 g | PACK | Freq: Every day | ORAL | 0 refills | Status: AC

## 2024-11-18 MED ORDER — IOHEXOL 300 MG/ML  SOLN
100.0000 mL | Freq: Once | INTRAMUSCULAR | Status: AC | PRN
  Administered 2024-11-18: 100 mL via INTRAVENOUS

## 2024-11-18 MED ORDER — FAMOTIDINE 20 MG PO TABS
20.0000 mg | ORAL_TABLET | Freq: Once | ORAL | Status: AC
  Administered 2024-11-18: 20 mg via ORAL
  Filled 2024-11-18: qty 1

## 2024-11-18 NOTE — Discharge Instructions (Signed)
 You were evaluated in the emergency room for abdominal pain.  Your CT scan did show constipation with likely enteritis.  Prescription for MiraLAX was sent to your pharmacy.  Please take this twice daily in addition to the Dulcolax twice daily.  Please follow with your primary care doctor to ensure symptoms are improving.  If you experience any new or worsening symptoms please return to the emergency room.

## 2024-11-18 NOTE — ED Provider Notes (Signed)
 [facility] AT [facility] Provider Note   CSN: [id number] Arrival date & time: 11/18/24  1159     Patient presents with: Abdominal Pain   Brandon Barr is a 48 y.o. male with history of ADHD evaluated for generalized abdominal pain that started yesterday.  Is associated with nausea without vomiting or diarrhea.  No significant urinary symptoms.  Last bowel movement was yesterday.  Has no prior abdominal surgeries.  He states that he feels like his abdomen is swollen.  Reports decreased p.o. intake due to discomfort.  Pain is notably worse right after eating.  Denies frequent use of NSAIDs.  No recent travel.    Abdominal Pain  Past Medical History:  Diagnosis Date   ADHD (attention deficit hyperactivity disorder)    Asthma    Celiac disease    GERD (gastroesophageal reflux disease)    Meningitis, viral    2004   Past Surgical History:  Procedure Laterality Date   MENISECTOMY     left knee and plica removed   TONSILLECTOMY Left 09/12/2021   Procedure: TONSILLECTOMY;  Surgeon: Milissa Hamming, MD;  Location: [facility];  Service: ENT;  Laterality: Left;       Prior to Admission medications  Medication Sig Start Date End Date Taking? Authorizing Provider  albuterol  (VENTOLIN  HFA) 108 (90 Base) MCG/ACT inhaler Inhale 1-2 puffs into the lungs every 6 (six) hours as needed for shortness of breath. 01/29/24   Cleatus Arlyss RAMAN, MD  amphetamine -dextroamphetamine  (ADDERALL) 30 MG tablet Take 1 tablet by mouth 2 (two) times daily. 06/03/24   Cleatus Arlyss RAMAN, MD  amphetamine -dextroamphetamine  (ADDERALL) 30 MG tablet Take 1 tablet by mouth 2 (two) times daily. 06/03/24   Cleatus Arlyss RAMAN, MD  amphetamine -dextroamphetamine  (ADDERALL) 30 MG tablet Take 1 tablet by mouth 2 (two) times daily. 06/03/24   Cleatus Arlyss RAMAN, MD  cyanocobalamin  (VITAMIN B12) 1000 MCG/ML injection 1000 mcg injected monthly. 06/03/24   Cleatus Arlyss RAMAN, MD  EPINEPHrine   (EPIPEN  2-PAK) 0.3 mg/0.3 mL IJ SOAJ injection Inject 0.3 mg into the muscle as needed for anaphylaxis. 06/03/24   Cleatus Arlyss RAMAN, MD  Multiple Vitamin (MULTIVITAMIN) tablet Take 1 tablet by mouth daily.    [provider]  Nattokinase 100 MG CAPS Take 200 mg by mouth.    [provider]  polyethylene glycol (MIRALAX) 17 g packet Take 17 g by mouth daily. 11/18/24  Yes Misaki Sozio H, PA-C  SYRINGE-NEEDLE, DISP, 3 ML (BD INTEGRA SYRINGE) 25G X 1 3 ML MISC Use for B12 injection. 06/03/24   Cleatus Arlyss RAMAN, MD  tadalafil  (CIALIS ) 5 MG tablet Take 1 tablet (5 mg total) by mouth daily. 10/07/24   Cleatus Arlyss RAMAN, MD  VITAMIN D PO Take by mouth.    [provider]    Allergies: Shellfish allergy and Gluten meal    Review of Systems  Gastrointestinal:  Positive for abdominal pain.    Updated Vital Signs BP (!) 155/113 (BP Location: Right Arm)   Pulse 76   Temp 98.2 F (36.8 C) (Oral)   Resp 16   SpO2 97%   Physical Exam Vitals and nursing note reviewed.  Constitutional:      General: He is not in acute distress.    Appearance: He is well-developed.  HENT:     Head: Normocephalic and atraumatic.  Eyes:     Conjunctiva/sclera: Conjunctivae normal.  Cardiovascular:     Rate and Rhythm: Normal rate and regular rhythm.  Heart sounds: No murmur heard. Pulmonary:     Effort: Pulmonary effort is normal. No respiratory distress.     Breath sounds: Normal breath sounds.  Abdominal:     Comments: Generalized abdominal tenderness more notably to the left upper quadrant.  Abdomen is somewhat distended, no peritoneal signs  Musculoskeletal:        General: No swelling.     Cervical back: Neck supple.  Skin:    General: Skin is warm and dry.     Capillary Refill: Capillary refill takes less than 2 seconds.  Neurological:     Mental Status: He is alert.  Psychiatric:        Mood and Affect: Mood normal.     (all labs ordered are listed, but only  abnormal results are displayed) Labs Reviewed  COMPREHENSIVE METABOLIC PANEL WITH GFR - Abnormal; Notable for the following components:      Result Value   Glucose, Bld 125 (*)    Total Bilirubin 1.5 (*)    All other components within normal limits  CBC - Abnormal; Notable for the following components:   RBC 6.21 (*)    Hemoglobin 18.5 (*)    HCT 53.7 (*)    All other components within normal limits  LIPASE, BLOOD  URINALYSIS, ROUTINE W REFLEX MICROSCOPIC    EKG: None  Radiology: CT ABDOMEN PELVIS W CONTRAST Result Date: 11/18/2024 CLINICAL DATA:  Acute abdominal pain. EXAM: CT ABDOMEN AND PELVIS WITH CONTRAST TECHNIQUE: Multidetector CT imaging of the abdomen and pelvis was performed using the standard protocol following bolus administration of intravenous contrast. RADIATION DOSE REDUCTION: This exam was performed according to the departmental dose-optimization program which includes automated exposure control, adjustment of the mA and/or kV according to patient size and/or use of iterative reconstruction technique. CONTRAST:  100mL OMNIPAQUE IOHEXOL 300 MG/ML  SOLN COMPARISON:  None Available. FINDINGS: Lower Chest: No acute findings. Hepatobiliary: No suspicious hepatic masses identified. Gallbladder is unremarkable. No evidence of biliary ductal dilatation. Pancreas:  No mass or inflammatory changes. Spleen: Within normal limits in size and appearance. Adrenals/Urinary Tract: No suspicious masses identified. No evidence of ureteral calculi or hydronephrosis. Stomach/Bowel: Mild dilatation of wall thickening is seen involving proximal and mid small bowel loops with soft tissue stranding in adjacent mesenteric fat. Small amount of ascites is noted however, there is no evidence of free intraperitoneal air or pneumatosis. Distal small bowel is nondilated, however, no discrete transition point identified. These findings are most consistent with infectious or inflammatory enteritis. No evidence  of abscess. Vascular/Lymphatic: No pathologically enlarged lymph nodes. No acute vascular findings. Reproductive:  No mass or other significant abnormality. Other:  None. Musculoskeletal:  No suspicious bone lesions identified. IMPRESSION: Findings consistent with enteritis, which may be infectious or inflammatory etiology. No evidence of pneumatosis, abscess, or free intraperitoneal air. Mild ascites. Electronically Signed   By: Norleen DELENA Kil M.D.   On: 11/18/2024 15:13     Procedures   Medications Ordered in the ED  alum & mag hydroxide-simeth (MAALOX/MYLANTA) 200-200-20 MG/5ML suspension 30 mL (30 mLs Oral Given 11/18/24 1301)  famotidine (PEPCID) tablet 20 mg (20 mg Oral Given 11/18/24 1301)  iohexol (OMNIPAQUE) 300 MG/ML solution 100 mL (100 mLs Intravenous Contrast Given 11/18/24 1404)    Clinical Course as of 11/18/24 1544  Thu Nov 18, 2024  1249 Otherwise healthy patient evaluated for generalized abdominal pain that started yesterday with associated nausea and decreased p.o. intake.  Upon arrival he is hypertensive.  Otherwise hemodynamically  stable.  On exam he has generalized abdominal tenderness worse to the left upper quadrant.  His abdomen is somewhat distended.  Fast concerning for trace amount of fluid in Morison's pouch.  Obtain routine labs and CT imaging. [JT]  1330 CBC(!) No significant abnormality [JT]  1330 Lipase, blood Within normal limits [JT]  1330 Comprehensive metabolic panel(!) No significant findings [JT]  1330 Urinalysis, Routine w reflex microscopic -Urine, Clean Catch Unremarkable [JT]  1520 CT ABDOMEN PELVIS W CONTRAST Findings consistent with enteritis, may be infectious or inflammatory.  Small amount of ascites without any evidence of intraperitoneal air or pneumatosis [JT]  1524 Findings consistent with enteritis.  Patient will be discharged home with strict return precautions and PCP follow-up.  Patient is understanding and in agreement with plan. [JT]     Clinical Course User Index [JT] Donnajean Lynwood DEL, PA-C                                 Medical Decision Making Amount and/or Complexity of Data Reviewed Labs: ordered.   This patient presents to the ED with chief complaint(s) of abdominal pain.  The complaint involves an extensive differential diagnosis and also carries with it a high risk of complications and morbidity.   Pertinent past medical history as listed in HPI  The differential diagnosis includes  Cirrhosis, SBO, appendicitis, cholecystitis, pancreatitis, AAA   additional history obtained: Additional history obtained from family Records reviewed Care Everywhere/External Records  Disposition:   Patient will be discharged home. The patient has been appropriately medically screened and/or stabilized in the ED. I have low suspicion for any other emergent medical condition which would require further screening, evaluation or treatment in the ED or require inpatient management. At time of discharge the patient is hemodynamically stable and in no acute distress. I have discussed work-up results and diagnosis with patient and answered all questions. Patient is agreeable with discharge plan. We discussed strict return precautions for returning to the emergency department and they verbalized understanding.     Social Determinants of Health:   none  This note was dictated with voice recognition software.  Despite best efforts at proofreading, errors may have occurred which can change the documentation meaning.       Final diagnoses:  Generalized abdominal pain    ED Discharge Orders          Ordered    polyethylene glycol (MIRALAX) 17 g packet  Daily        11/18/24 1541               Donnajean Lynwood DEL, PA-C 11/18/24 1546    Elnor Jayson LABOR, DO 11/19/24 1511

## 2024-11-18 NOTE — ED Triage Notes (Signed)
 Pt came in for abdominal pain x24 hours. Pt stated his stomach is bloated and painful to touch. Pt stated he hasn't had any vomiting or diarrhea, but only pain down the middle of his abdomen.

## 2024-11-19 ENCOUNTER — Ambulatory Visit: Admitting: Family Medicine

## 2024-11-23 ENCOUNTER — Telehealth: Payer: Self-pay

## 2024-11-23 NOTE — Transitions of Care (Post Inpatient/ED Visit) (Unsigned)
 11/17/24 had constant sharpe upper abd pain; pt had abd swelling & nausea but no vomiting or diarrhea.No fever, no UTI symptoms.pt said abd pain seemed worse after eating. pt has not taken miralax . pt said got home from ED had good BM and no pain since. Pt did not schedule FU appt since pt has not had any pain since good BM. Pt will call for appt if needed. UC & ED precautions given and pt voiced understanding. Sending note to Dr Cleatus.       11/23/2024  Name: Brandon Barr MRN: [id number] DOB: 1976/11/08  Today's TOC FU Call Status: Today's TOC FU Call Status:: Successful TOC FU Call Completed TOC FU Call Complete Date: 11/18/24  Patient's Name and Date of Birth confirmed. Name, DOB  Transition Care Management Follow-up Telephone Call Date of Discharge: 11/18/24 Discharge Facility: [facility]) Type of Discharge: Emergency Department Reason for ED Visit: Other: (11/17/24 had constant sharpe upper abd pain; pt had abd swelling & nausea but no vomiting or diarrhea.No fever, no UTI symptoms.pt said abd pain seemed worse after eating. pt has not taken miralax . pt said got home from ED had good BM and no pain since.) How have you been since you were released from the hospital?: Better Any questions or concerns?: No  Items Reviewed: Did you receive and understand the discharge instructions provided?: Yes Medications obtained,verified, and reconciled?: Yes (Medications Reviewed) Any new allergies since your discharge?: No Dietary orders reviewed?: Yes Type of Diet Ordered:: regular Do you have support at home?: Yes People in Home [RPT]: spouse Name of Support/Comfort Primary Source: Brandon Barr  Medications Reviewed Today: Medications Reviewed Today   Medications were not reviewed in this encounter     Home Care and Equipment/Supplies: Were Home Health Services Ordered?: NA Any new equipment or medical supplies ordered?: NA  Functional Questionnaire: Do you need  assistance with bathing/showering or dressing?: No Do you need assistance with meal preparation?: No Do you need assistance with eating?: No Do you have difficulty maintaining continence: No Do you need assistance with getting out of bed/getting out of a chair/moving?: No Do you have difficulty managing or taking your medications?: No  Follow up appointments reviewed: PCP Follow-up appointment confirmed?: NA (pt said no pain since had good BM and pt will call for appt if needed.) Specialist Hospital Follow-up appointment confirmed?: NA Do you need transportation to your follow-up appointment?: No Do you understand care options if your condition(s) worsen?: Yes-patient verbalized understanding    SIGNATURE Laray Arenas, LPN

## 2024-11-24 NOTE — Telephone Encounter (Signed)
 Noted. Thanks.

## 2024-12-19 ENCOUNTER — Encounter: Payer: Self-pay | Admitting: Family Medicine

## 2024-12-22 MED ORDER — TADALAFIL 5 MG PO TABS: 5.0000 mg | ORAL_TABLET | Freq: Every day | ORAL | 11 refills | Status: AC

## 2024-12-22 MED ORDER — AMPHETAMINE-DEXTROAMPHETAMINE 30 MG PO TABS: 30.0000 mg | ORAL_TABLET | Freq: Two times a day (BID) | ORAL | 0 refills | Status: AC
